# Patient Record
Sex: Female | Born: 1985 | Race: White | Hispanic: No | Marital: Married | Smoking: Never smoker
Health system: Southern US, Community
[De-identification: ages and names within clinical notes are randomized; demographics above are authoritative.]

## PROBLEM LIST (undated history)

## (undated) DIAGNOSIS — E872 Acidosis: Secondary | ICD-10-CM

## (undated) DIAGNOSIS — G44009 Cluster headache syndrome, unspecified, not intractable: Secondary | ICD-10-CM

## (undated) DIAGNOSIS — E119 Type 2 diabetes mellitus without complications: Secondary | ICD-10-CM

## (undated) DIAGNOSIS — E7800 Pure hypercholesterolemia, unspecified: Secondary | ICD-10-CM

## (undated) DIAGNOSIS — R079 Chest pain, unspecified: Secondary | ICD-10-CM

## (undated) DIAGNOSIS — R55 Syncope and collapse: Secondary | ICD-10-CM

## (undated) DIAGNOSIS — D72829 Elevated white blood cell count, unspecified: Secondary | ICD-10-CM

## (undated) DIAGNOSIS — K219 Gastro-esophageal reflux disease without esophagitis: Secondary | ICD-10-CM

## (undated) DIAGNOSIS — E785 Hyperlipidemia, unspecified: Secondary | ICD-10-CM

## (undated) HISTORY — DX: Chest pain, unspecified: R07.9

## (undated) HISTORY — PX: ABDOMINAL SURGERY: SHX537

## (undated) HISTORY — DX: Type 2 diabetes mellitus without complications: E11.9

## (undated) HISTORY — DX: Syncope and collapse: R55

## (undated) HISTORY — DX: Hyperlipidemia, unspecified: E78.5

## (undated) HISTORY — PX: BACK SURGERY: SHX140

---
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
## 2016-09-27 ENCOUNTER — Encounter (HOSPITAL_COMMUNITY): Payer: Self-pay | Admitting: *Deleted

## 2016-09-27 ENCOUNTER — Emergency Department (HOSPITAL_COMMUNITY): Payer: BLUE CROSS/BLUE SHIELD

## 2016-09-27 ENCOUNTER — Emergency Department (HOSPITAL_COMMUNITY)
Admission: EM | Admit: 2016-09-27 | Discharge: 2016-09-27 | Disposition: A | Discharge status: Home / Self Care | Payer: BLUE CROSS/BLUE SHIELD | Attending: Emergency Medicine | Admitting: Emergency Medicine

## 2016-09-27 DIAGNOSIS — R519 Headache, unspecified: Secondary | ICD-10-CM

## 2016-09-27 DIAGNOSIS — R51 Headache: Secondary | ICD-10-CM | POA: Diagnosis not present

## 2016-09-27 HISTORY — DX: Pure hypercholesterolemia, unspecified: E78.00

## 2016-09-27 HISTORY — DX: Cluster headache syndrome, unspecified, not intractable: G44.009

## 2016-09-27 LAB — BASIC METABOLIC PANEL
ANION GAP: 10 (ref 5–15)
BUN: 8 mg/dL (ref 6–20)
CALCIUM: 9.2 mg/dL (ref 8.9–10.3)
CHLORIDE: 104 mmol/L (ref 101–111)
CO2: 24 mmol/L (ref 22–32)
CREATININE: 0.86 mg/dL (ref 0.44–1.00)
GFR calc non Af Amer: >60 mL/min (ref >60)
Glucose, Bld: 80 mg/dL (ref 65–99)
Potassium: 3.5 mmol/L (ref 3.5–5.1)
SODIUM: 138 mmol/L (ref 135–145)

## 2016-09-27 LAB — CBC
HEMATOCRIT: 44.5 % (ref 36.0–46.0)
HEMOGLOBIN: 15 g/dL (ref 12.0–15.0)
MCH: 31.1 pg (ref 26.0–34.0)
MCHC: 33.7 g/dL (ref 30.0–36.0)
MCV: 92.1 fL (ref 78.0–100.0)
Platelets: 301 10*3/uL (ref 150–400)
RBC: 4.83 MIL/uL (ref 3.87–5.11)
RDW: 13 % (ref 11.5–15.5)
WBC: 11.3 10*3/uL — AB (ref 4.0–10.5)

## 2016-09-27 MED ORDER — METOCLOPRAMIDE HCL 5 MG/ML IJ SOLN
10.0000 mg | Freq: Once | INTRAMUSCULAR | Status: AC
  Administered 2016-09-27: 10 mg via INTRAVENOUS
  Filled 2016-09-27: qty 2

## 2016-09-27 MED ORDER — SODIUM CHLORIDE 0.9 % IV BOLUS (SEPSIS)
1000.0000 mL | Freq: Once | INTRAVENOUS | Status: AC
  Administered 2016-09-27: 1000 mL via INTRAVENOUS

## 2016-09-27 MED ORDER — DIPHENHYDRAMINE HCL 50 MG/ML IJ SOLN
25.0000 mg | Freq: Once | INTRAMUSCULAR | Status: AC
  Administered 2016-09-27: 25 mg via INTRAVENOUS
  Filled 2016-09-27: qty 1

## 2016-09-27 MED ORDER — DEXAMETHASONE SODIUM PHOSPHATE 10 MG/ML IJ SOLN
10.0000 mg | Freq: Once | INTRAMUSCULAR | Status: AC
  Administered 2016-09-27: 10 mg via INTRAVENOUS
  Filled 2016-09-27: qty 1

## 2016-09-27 MED ORDER — KETOROLAC TROMETHAMINE 30 MG/ML IJ SOLN
30.0000 mg | Freq: Once | INTRAMUSCULAR | Status: AC
  Administered 2016-09-27: 30 mg via INTRAVENOUS
  Filled 2016-09-27: qty 1

## 2016-09-27 MED ORDER — MORPHINE SULFATE (PF) 4 MG/ML IV SOLN
4.0000 mg | Freq: Once | INTRAVENOUS | Status: AC
  Administered 2016-09-27: 4 mg via INTRAVENOUS
  Filled 2016-09-27: qty 1

## 2016-09-27 NOTE — ED Notes (Signed)
The pt is c/o an intense pain to the back of her head since yesterday with nausea and she hurts down her entire spine.  Hx of cluster headaches several years ago

## 2016-09-27 NOTE — ED Triage Notes (Addendum)
Pt reports onset yesterday of pain to back of head. Also has back pain and intermittent numbness to bilateral arms and legs. Ambulatory at triage. Having nausea, no vomiting. Hx of cluster headaches. Reports if she turns her head, has shooting pain from back of head and radiates all the way down her back and to her feet.

## 2016-09-27 NOTE — ED Notes (Signed)
She has not taken any medication for pain since yesterday

## 2016-09-27 NOTE — ED Notes (Signed)
To ct

## 2016-09-27 NOTE — Discharge Instructions (Signed)
Please read and follow all provided instructions.  Your diagnoses today include:  1. Nonintractable headache, unspecified chronicity pattern, unspecified headache type     Tests performed today include: CT of your head which was normal and did not show any serious cause of your headache Vital signs. See below for your results today.   Medications:  In the Emergency Department you received: Reglan - antinausea/headache medication Benadryl - antihistamine to counteract potential side effects of reglan Toradol - NSAID medication similar to ibuprofen  Take any prescribed medications only as directed.  Additional information:  Follow any educational materials contained in this packet.  You are having a headache. No specific cause was found today for your headache. It may have been a migraine or other cause of headache. Stress, anxiety, fatigue, and depression are common triggers for headaches.   Your headache today does not appear to be life-threatening or require hospitalization, but often the exact cause of headaches is not determined in the emergency department. Therefore, follow-up with your doctor is very important to find out what may have caused your headache and whether or not you need any further diagnostic testing or treatment.   Sometimes headaches can appear benign (not harmful), but then more serious symptoms can develop which should prompt an immediate re-evaluation by your doctor or the emergency department.  BE VERY CAREFUL not to take multiple medicines containing Tylenol (also called acetaminophen). Doing so can lead to an overdose which can damage your liver and cause liver failure and possibly death.   Follow-up instructions: Please follow-up with your primary care provider in the next 3 days for further evaluation of your symptoms.   Return instructions:  Please return to the Emergency Department if you experience worsening symptoms. Return if the medications do not  resolve your headache, if it recurs, or if you have multiple episodes of vomiting or cannot keep down fluids. Return if you have a change from the usual headache. RETURN IMMEDIATELY IF you: Develop a sudden, severe headache Develop confusion or become poorly responsive or faint Develop a fever above 100.51F or problem breathing Have a change in speech, vision, swallowing, or understanding Develop new weakness, numbness, tingling, incoordination in your arms or legs Have a seizure Please return if you have any other emergent concerns.  Additional Information:  Your vital signs today were: BP 118/65    Pulse 98    Temp 97.6 F (36.4 C) (Oral)    Resp 20    LMP 09/10/2016    SpO2 100%  If your blood pressure (BP) was elevated above 135/85 this visit, please have this repeated by your doctor within one month. --------------

## 2016-09-27 NOTE — ED Provider Notes (Signed)
[facility]-EMERGENCY DEPT Provider Note   CSN: [id number] Arrival date & time: 09/27/16  1313  History   Chief Complaint Chief Complaint  Patient presents with  . Headache  . Numbness  . Back Pain    HPI Audrey Greene is a 31 y.o. female.  HPI  31 y.o. female with a hx of Cluster Headache, presents to the Emergency Department today complaining of posterior headache with onset yesterday while at rest. Rates pain 9/10. Intermittent with bouts lasting 1-2 hours. Pt states gradual in onset with associated blurriness in vision intermittently. Nausea with no emesis. Numbness BUE without weakness. Attempted OTC remedies with minimal relief. Pt also describes sensation down her spine from top of her head to lower lumbar spine. No associated fevers. No CP/SOB/ABD pain. Pt able to ambulate without difficulty. No other symptoms noted.     Past Medical History:  Diagnosis Date  . Cluster headaches   . High cholesterol     There are no active problems to display for this patient.   Past Surgical History:  Procedure Laterality Date  . BACK SURGERY      OB History    No data available       Home Medications    Prior to Admission medications   Not on File    Family History History reviewed. No pertinent family history.  Social History Social History  Substance Use Topics  . Smoking status: Never Smoker  . Smokeless tobacco: Not on file  . Alcohol use No     Allergies   Penicillins   Review of Systems Review of Systems ROS reviewed and all are negative for acute change except as noted in the HPI.  Physical Exam Updated Vital Signs BP 118/83 (BP Location: Right Arm)   Pulse 104   Temp 97.6 F (36.4 C) (Oral)   Resp 20   LMP 09/10/2016   SpO2 98%   Physical Exam  Constitutional: She is oriented to person, place, and time. Vital signs are normal. She appears well-developed and well-nourished.  HENT:  Head: Normocephalic and atraumatic.  Right Ear: Hearing normal.   Left Ear: Hearing normal.  Eyes: Conjunctivae and EOM are normal. Pupils are equal, round, and reactive to light.  Neck: Trachea normal, normal range of motion, full passive range of motion without pain and phonation normal. Neck supple. No spinous process tenderness and no muscular tenderness present. No neck rigidity. Normal range of motion present.  Cardiovascular: Normal rate, regular rhythm, normal heart sounds and intact distal pulses.   Pulmonary/Chest: Effort normal and breath sounds normal.  Abdominal: There is no tenderness.  Musculoskeletal: Normal range of motion.       Cervical back: Normal.       Thoracic back: Normal.       Lumbar back: Normal.  Neurological: She is alert and oriented to person, place, and time. She has normal strength. No cranial nerve deficit or sensory deficit.  Cranial Nerves:  II: Pupils equal, round, reactive to light III,IV, VI: ptosis not present, extra-ocular motions intact bilaterally  V,VII: smile symmetric, facial light touch sensation equal VIII: hearing grossly normal bilaterally  IX,X: midline uvula rise  XI: bilateral shoulder shrug equal and strong XII: midline tongue extension Negative pronator drift. Normal finger to nose exam. Equal sensation BUE sharp/dull. Grip strengths equal  Skin: Skin is warm and dry.  Psychiatric: She has a normal mood and affect. Her speech is normal and behavior is normal. Thought content normal.  Nursing note and  vitals reviewed.  ED Treatments / Results  Labs (all labs ordered are listed, but only abnormal results are displayed) Labs Reviewed  CBC - Abnormal; Notable for the following:       Result Value   WBC 11.3 (*)    All other components within normal limits  BASIC METABOLIC PANEL    EKG  EKG Interpretation None      Radiology Ct Head Wo Contrast  Result Date: 09/27/2016 CLINICAL DATA:  Headache and vomiting today. EXAM: CT HEAD WITHOUT CONTRAST TECHNIQUE: Contiguous axial images were  obtained from the base of the skull through the vertex without intravenous contrast. COMPARISON:  None. FINDINGS: Brain: Appears normal without hemorrhage, infarct, mass lesion, mass effect, midline shift or abnormal extra-axial fluid collection. No hydrocephalus or pneumocephalus. Vascular: Negative. Skull: Intact. Sinuses/Orbits: Negative. Other: None. IMPRESSION: Negative head CT. Electronically Signed   By: Drusilla Kanner M.D.   On: 09/27/2016 19:21    Procedures Procedures (including critical care time)  Medications Ordered in ED Medications - No data to display   Initial Impression / Assessment and Plan / ED Course  I have reviewed the triage vital signs and the nursing notes.  Pertinent labs & imaging results that were available during my care of the patient were reviewed by me and considered in my medical decision making (see chart for details).  Clinical Course    Final Clinical Impressions(s) / ED Diagnoses  {I have reviewed and evaluated the relevant laboratory values. {I have reviewed and evaluated the relevant imaging studies.  {I have reviewed the relevant previous healthcare records.  {I obtained HPI from historian. {Patient discussed with supervising physician.  ED Course:  Assessment: Patient is a 30yF hx Cluster headaches that presents with headache yesterday. Associated nausea. No emesis. Visual changes intermittent with blurriness. Numbness BUE intermittent. No fevers. No CP/SOB. Patient is without high-risk features of headache including: Sudden onset/thunderclap HA, No similar headache in past, Altered mental status, Accompanying seizure, Headache with exertion, Age > 50, History of immunocompromise, Neck or shoulder pain, Fever, Use of anticoagulation, Family history of spontaneous SAH, Concomitant drug use, Toxic exposure.  Patient has a normal complete neurological exam, normal vital signs, normal level of consciousness, no signs of meningismus, is  well-appearing/non-toxic appearing, no signs of trauma. No papilledema, no pain over the temporal arteries.CT Head unremarkable. CBC/BMP unremarkable. No dangerous or life-threatening conditions suspected or identified by history, physical exam, and by work-up. No indications for hospitalization identified. Pt symptoms relieved with migraine cocktail. Will DC home with follow up to Neurology for complex migraine. At time of discharge, Patient is in no acute distress. Vital Signs are stable. Patient is able to ambulate. Patient able to tolerate PO.   Disposition/Plan:  DC Home Additional Verbal discharge instructions given and discussed with patient.  Pt Instructed to f/u with PCP in the next week for evaluation and treatment of symptoms. Return precautions given Pt acknowledges and agrees with plan  Supervising Physician Lyndal Pulley, MD  Final diagnoses:  Nonintractable headache, unspecified chronicity pattern, unspecified headache type    New Prescriptions New Prescriptions   No medications on file     Audry Pili, PA-C 09/27/16 2032    Lyndal Pulley, MD 09/28/16 [phone]

## 2018-01-03 ENCOUNTER — Other Ambulatory Visit: Payer: Self-pay

## 2018-01-03 ENCOUNTER — Encounter (HOSPITAL_COMMUNITY): Payer: Self-pay

## 2018-01-03 ENCOUNTER — Emergency Department (HOSPITAL_COMMUNITY): Payer: BLUE CROSS/BLUE SHIELD

## 2018-01-03 ENCOUNTER — Emergency Department (HOSPITAL_COMMUNITY)
Admission: EM | Admit: 2018-01-03 | Discharge: 2018-01-04 | Disposition: A | Discharge status: Home / Self Care | Payer: BLUE CROSS/BLUE SHIELD | Attending: Emergency Medicine | Admitting: Emergency Medicine

## 2018-01-03 DIAGNOSIS — Y929 Unspecified place or not applicable: Secondary | ICD-10-CM | POA: Diagnosis not present

## 2018-01-03 DIAGNOSIS — S199XXA Unspecified injury of neck, initial encounter: Secondary | ICD-10-CM | POA: Diagnosis present

## 2018-01-03 DIAGNOSIS — Y999 Unspecified external cause status: Secondary | ICD-10-CM | POA: Insufficient documentation

## 2018-01-03 DIAGNOSIS — S161XXA Strain of muscle, fascia and tendon at neck level, initial encounter: Secondary | ICD-10-CM | POA: Diagnosis not present

## 2018-01-03 DIAGNOSIS — Y939 Activity, unspecified: Secondary | ICD-10-CM | POA: Insufficient documentation

## 2018-01-03 DIAGNOSIS — Z79899 Other long term (current) drug therapy: Secondary | ICD-10-CM | POA: Diagnosis not present

## 2018-01-03 DIAGNOSIS — X58XXXA Exposure to other specified factors, initial encounter: Secondary | ICD-10-CM | POA: Insufficient documentation

## 2018-01-03 DIAGNOSIS — M5412 Radiculopathy, cervical region: Secondary | ICD-10-CM | POA: Insufficient documentation

## 2018-01-03 NOTE — ED Triage Notes (Signed)
Pt presents with 2 day h/o R sided jaw pain that is now to R side of neck with R arm numbness.  Pt reports intermittent numbness to R arm, reports pain from neck that radiates down mid-back, into L buttock and down L leg.  Pt unsure of any dental issues, reports difficulty moving her head and difficulty swallowing.  Pt endorses fatigue.

## 2018-01-03 NOTE — ED Provider Notes (Signed)
Patient placed in Quick Look pathway, seen and evaluated .  Chief Complaint: R sided neck pain  HPI: Patient presents to ED for evaluation of 2-day history of right-sided lower dental pain that is now radiating to her right side of her neck, right arm.  She also reports intermittent "numbness" in her right arm that lasts for about 1 minute for each episode.  Symptoms will spontaneously appear and resolve.  States that she is most concerned about her right-sided neck pain.  Cannot recall any inciting event that may have triggered the symptoms.  Pain in right side of the neck is worse with movement and palpation.  ROS: R sided neck pain  Physical Exam:   Gen: No distress  Neuro: Awake and Alert  Skin: Warm    Focused Exam: No gross dental abscess noted.  No trismus, drooling or tonsillar enlargement noted.  Tenderness to palpation of the right paraspinal musculature of the cervical spine. Pain with neck ROM.  Equal grip strength in bilateral upper extremities and strength noted to be 5/5 in BUE. Normal sensation in BUE.   Initiation of care has begun. The patient has been counseled on the process, plan, and necessity for staying for the completion/evaluation, and the remainder of the medical screening examination.   Dietrich PatesKhatri, Story Vanvranken, PA-C 01/03/18 1600    Raeford RazorKohut, Stephen, MD 01/05/18 1008

## 2018-01-04 ENCOUNTER — Emergency Department (HOSPITAL_COMMUNITY): Payer: BLUE CROSS/BLUE SHIELD

## 2018-01-04 LAB — CBC WITH DIFFERENTIAL/PLATELET
BASOS ABS: 0 10*3/uL (ref 0.0–0.1)
BASOS PCT: 0 %
EOS ABS: 0.1 10*3/uL (ref 0.0–0.7)
EOS PCT: 1 %
HCT: 45.7 % (ref 36.0–46.0)
HEMOGLOBIN: 15.1 g/dL — AB (ref 12.0–15.0)
Lymphocytes Relative: 19 %
Lymphs Abs: 2.6 10*3/uL (ref 0.7–4.0)
MCH: 30.3 pg (ref 26.0–34.0)
MCHC: 33 g/dL (ref 30.0–36.0)
MCV: 91.8 fL (ref 78.0–100.0)
Monocytes Absolute: 0.6 10*3/uL (ref 0.1–1.0)
Monocytes Relative: 4 %
NEUTROS PCT: 76 %
Neutro Abs: 10.5 10*3/uL — ABNORMAL HIGH (ref 1.7–7.7)
PLATELETS: 290 10*3/uL (ref 150–400)
RBC: 4.98 MIL/uL (ref 3.87–5.11)
RDW: 13.3 % (ref 11.5–15.5)
WBC: 13.8 10*3/uL — AB (ref 4.0–10.5)

## 2018-01-04 LAB — BASIC METABOLIC PANEL
Anion gap: 12 (ref 5–15)
BUN: 7 mg/dL (ref 6–20)
CO2: 21 mmol/L — ABNORMAL LOW (ref 22–32)
Calcium: 9.1 mg/dL (ref 8.9–10.3)
Chloride: 103 mmol/L (ref 101–111)
Creatinine, Ser: 0.76 mg/dL (ref 0.44–1.00)
Glucose, Bld: 127 mg/dL — ABNORMAL HIGH (ref 65–99)
POTASSIUM: 3.6 mmol/L (ref 3.5–5.1)
SODIUM: 136 mmol/L (ref 135–145)

## 2018-01-04 MED ORDER — DEXAMETHASONE SODIUM PHOSPHATE 10 MG/ML IJ SOLN
10.0000 mg | Freq: Once | INTRAMUSCULAR | Status: AC
Start: 2018-01-04 — End: 2018-01-04
  Administered 2018-01-04: 10 mg via INTRAVENOUS
  Filled 2018-01-04: qty 1

## 2018-01-04 MED ORDER — HYDROMORPHONE HCL 2 MG/ML IJ SOLN
1.0000 mg | Freq: Once | INTRAMUSCULAR | Status: AC
  Administered 2018-01-04: 1 mg via INTRAVENOUS
  Filled 2018-01-04: qty 1

## 2018-01-04 MED ORDER — HYDROCODONE-ACETAMINOPHEN 5-325 MG PO TABS: 1.0000 | ORAL_TABLET | ORAL | 0 refills | Status: DC | PRN

## 2018-01-04 MED ORDER — ONDANSETRON HCL 4 MG/2ML IJ SOLN
4.0000 mg | Freq: Once | INTRAMUSCULAR | Status: AC
  Administered 2018-01-04: 4 mg via INTRAVENOUS
  Filled 2018-01-04: qty 2

## 2018-01-04 MED ORDER — PREDNISONE 20 MG PO TABS: ORAL_TABLET | ORAL | 0 refills | Status: DC

## 2018-01-04 MED ORDER — IBUPROFEN 800 MG PO TABS: 800.0000 mg | ORAL_TABLET | Freq: Three times a day (TID) | ORAL | 0 refills | Status: DC | PRN

## 2018-01-04 NOTE — ED Provider Notes (Signed)
[facility]   CSN: [id number] Arrival date & time: 01/03/18  1430     History   Chief Complaint Chief Complaint  Patient presents with  . Neck Pain    HPI Audrey Greene is a 32 y.o. female.  Patient presents with complaints of pain in her neck.  Symptoms began 2 days ago.  Patient reports a sharp, constant pain in the neck with radiation to the head, face as well as down her right arm.  She denies any injury.  She has a history of lumbar surgery but no history of cervical pathology.  Patient has not noticed weakness of her upper extremities but does notice numbness of the right arm.  All of her symptoms worsen with movements of the head and neck.     Past Medical History:  Diagnosis Date  . Cluster headaches   . High cholesterol     There are no active problems to display for this patient.   Past Surgical History:  Procedure Laterality Date  . ABDOMINAL SURGERY    . BACK SURGERY       OB History   None      Home Medications    Prior to Admission medications   Medication Sig Start Date End Date Taking? Authorizing Provider  BACLOFEN PO Take 1 tablet by mouth once.   Yes [provider]  cetirizine (ZYRTEC) 10 MG tablet Take 10 mg by mouth daily as needed for allergies.   Yes [provider]  HYDROcodone-acetaminophen (NORCO/VICODIN) 5-325 MG tablet Take 1 tablet by mouth every 4 (four) hours as needed for moderate pain. 01/04/18   Gilda CreasePollina, Twila Rappa J, MD  ibuprofen (ADVIL,MOTRIN) 800 MG tablet Take 1 tablet (800 mg total) by mouth every 8 (eight) hours as needed for moderate pain. 01/04/18   Gilda CreasePollina, Jaceion Aday J, MD  predniSONE (DELTASONE) 20 MG tablet 3 tabs po daily x 3 days, then 2 tabs x 3 days, then 1.5 tabs x 3 days, then 1 tab x 3 days, then 0.5 tabs x 3 days 01/04/18   Gilda CreasePollina, Jatasia Gundrum J, MD    Family History History reviewed. No pertinent family history.  Social  History Social History   Tobacco Use  . Smoking status: Never Smoker  . Smokeless tobacco: Never Used  Substance Use Topics  . Alcohol use: No  . Drug use: No     Allergies   Penicillins   Review of Systems Review of Systems  Musculoskeletal: Positive for neck pain.  Neurological: Positive for numbness. Negative for weakness.  All other systems reviewed and are negative.    Physical Exam Updated Vital Signs BP 112/79   Pulse 77   Temp 98 F (36.7 C) (Oral)   Resp 16   Ht 5\' 2"  (1.575 m)   Wt 102.1 kg (225 lb)   LMP 01/03/2018   SpO2 93%   BMI 41.15 kg/m   Physical Exam  Constitutional: She is oriented to person, place, and time. She appears well-developed and well-nourished. No distress.  HENT:  Head: Normocephalic and atraumatic.  Right Ear: Hearing normal.  Left Ear: Hearing normal.  Nose: Nose normal.  Mouth/Throat: Oropharynx is clear and moist and mucous membranes are normal.  Eyes: Pupils are equal, round, and reactive to light. Conjunctivae and EOM are normal.  Neck: Neck supple. Muscular tenderness present. Decreased range of motion present.  Cardiovascular: Regular rhythm, S1 normal and S2 normal. Exam reveals no gallop and no friction rub.  No murmur heard. Pulmonary/Chest: Effort normal and breath sounds normal. No respiratory distress. She exhibits no tenderness.  Abdominal: Soft. Normal appearance and bowel sounds are normal. There is no hepatosplenomegaly. There is no tenderness. There is no rebound, no guarding, no tenderness at McBurney's point and negative Murphy's sign. No hernia.  Neurological: She is alert and oriented to person, place, and time. She has normal strength. No cranial nerve deficit or sensory deficit. Coordination normal. GCS eye subscore is 4. GCS verbal subscore is 5. GCS motor subscore is 6.  5 out of 5 strength bilateral upper extremities with normal sensation to light touch  Skin: Skin is warm, dry and intact. No rash noted.  No cyanosis.  Psychiatric: She has a normal mood and affect. Her speech is normal and behavior is normal. Thought content normal.  Nursing note and vitals reviewed.    ED Treatments / Results  Labs (all labs ordered are listed, but only abnormal results are displayed) Labs Reviewed  CBC WITH DIFFERENTIAL/PLATELET - Abnormal; Notable for the following components:      Result Value   WBC 13.8 (*)    Hemoglobin 15.1 (*)    Neutro Abs 10.5 (*)    All other components within normal limits  BASIC METABOLIC PANEL - Abnormal; Notable for the following components:   CO2 21 (*)    Glucose, Bld 127 (*)    All other components within normal limits    EKG None  Radiology Dg Cervical Spine Complete  Result Date: 01/03/2018 CLINICAL DATA:  Right-sided neck pain for 1 day, no known injury, initial encounter EXAM: CERVICAL SPINE - COMPLETE 4+ VIEW COMPARISON:  None. FINDINGS: Seven cervical segments are well visualized. Vertebral body height is well maintained. The neural foramina are widely patent bilaterally. No soft tissue abnormality is seen. No acute fracture or acute facet abnormality is noted. The odontoid is within normal limits. IMPRESSION: No acute abnormality noted. Electronically Signed   By: Alcide Clever M.D.   On: 01/03/2018 17:04   Mr Cervical Spine Wo Contrast  Result Date: 01/04/2018 CLINICAL DATA:  Initial evaluation for acute right-sided jaw pain with extension into the right neck, with right arm numbness. EXAM: MRI CERVICAL SPINE WITHOUT CONTRAST TECHNIQUE: Multiplanar, multisequence MR imaging of the cervical spine was performed. No intravenous contrast was administered. COMPARISON:  Prior radiograph from 01/03/2018. FINDINGS: Alignment: Straightening of the normal cervical lordosis. No listhesis or subluxation. Vertebrae: Vertebral body heights are well maintained without evidence for acute or chronic fracture. Bone marrow signal intensity within normal limits. No discrete or  worrisome osseous lesions. No abnormal marrow edema. Cord: Signal intensity within the cervical spinal cord is normal. Cord caliber and morphology are normal. Posterior Fossa, vertebral arteries, paraspinal tissues: Visualized brain and posterior fossa within normal limits. Craniocervical junction normal. Paraspinous and prevertebral soft tissues within normal limits. Normal intravascular flow voids present within the vertebral arteries bilaterally. Disc levels: C2-C3: Unremarkable. C3-C4:  Unremarkable. C4-C5:  Unremarkable. C5-C6: Mild annular disc bulge. No canal or neural foraminal stenosis. C6-C7: Mild diffuse annular disc bulge. No canal or foraminal stenosis. C7-T1:  Unremarkable. Visualized upper thoracic spine demonstrates no significant finding. IMPRESSION: 1. Mild annular disc bulging at C5-6 and C6-7 without significant stenosis. 2. Otherwise unremarkable MRI of the cervical spine. Electronically Signed   By: Rise Mu M.D.   On: 01/04/2018 03:03    Procedures Procedures (including critical care time)  Medications Ordered in ED Medications  HYDROmorphone (DILAUDID) injection 1 mg (1 mg  Intravenous Given 01/04/18 0054)  ondansetron (ZOFRAN) injection 4 mg (4 mg Intravenous Given 01/04/18 0054)  dexamethasone (DECADRON) injection 10 mg (10 mg Intravenous Given 01/04/18 0054)     Initial Impression / Assessment and Plan / ED Course  I have reviewed the triage vital signs and the nursing notes.  Pertinent labs & imaging results that were available during my care of the patient were reviewed by me and considered in my medical decision making (see chart for details).     Patient presents to the ER for evaluation of neck pain that radiates to the head and down her right arm.  She has a history of lower back problems requiring previous lumbar surgery.  She has not had previous cervical spine pathology.  She is reporting numbness of the right arm but does not have decreased sensation  or strength.  Patient underwent cervical spine MRI to further evaluate.  There is no significant pathology.  She is experiencing severe pain with any movement of the neck.  Radiculopathy is likely secondary to severe spasm of the neck. Treat with analgesia.  Final Clinical Impressions(s) / ED Diagnoses   Final diagnoses:  Acute strain of neck muscle, initial encounter  Cervical radiculopathy    ED Discharge Orders        Ordered    predniSONE (DELTASONE) 20 MG tablet     01/04/18 0340    ibuprofen (ADVIL,MOTRIN) 800 MG tablet  Every 8 hours PRN     01/04/18 0340    HYDROcodone-acetaminophen (NORCO/VICODIN) 5-325 MG tablet  Every 4 hours PRN     01/04/18 0340       Gilda Crease, MD 01/04/18 [phone]

## 2018-11-10 ENCOUNTER — Observation Stay (HOSPITAL_COMMUNITY)
Admission: EM | Admit: 2018-11-10 | Discharge: 2018-11-11 | Disposition: A | Discharge status: Home / Self Care | Payer: BLUE CROSS/BLUE SHIELD | Attending: Internal Medicine | Admitting: Internal Medicine

## 2018-11-10 ENCOUNTER — Other Ambulatory Visit: Payer: Self-pay

## 2018-11-10 ENCOUNTER — Emergency Department (HOSPITAL_COMMUNITY): Payer: BLUE CROSS/BLUE SHIELD

## 2018-11-10 ENCOUNTER — Encounter (HOSPITAL_COMMUNITY): Payer: Self-pay | Admitting: Emergency Medicine

## 2018-11-10 DIAGNOSIS — E785 Hyperlipidemia, unspecified: Secondary | ICD-10-CM | POA: Diagnosis present

## 2018-11-10 DIAGNOSIS — R079 Chest pain, unspecified: Secondary | ICD-10-CM | POA: Diagnosis not present

## 2018-11-10 DIAGNOSIS — E872 Acidosis, unspecified: Secondary | ICD-10-CM | POA: Diagnosis present

## 2018-11-10 DIAGNOSIS — R55 Syncope and collapse: Secondary | ICD-10-CM

## 2018-11-10 DIAGNOSIS — G43909 Migraine, unspecified, not intractable, without status migrainosus: Secondary | ICD-10-CM | POA: Insufficient documentation

## 2018-11-10 DIAGNOSIS — K219 Gastro-esophageal reflux disease without esophagitis: Secondary | ICD-10-CM | POA: Diagnosis not present

## 2018-11-10 DIAGNOSIS — Z79899 Other long term (current) drug therapy: Secondary | ICD-10-CM | POA: Insufficient documentation

## 2018-11-10 DIAGNOSIS — R600 Localized edema: Secondary | ICD-10-CM | POA: Insufficient documentation

## 2018-11-10 DIAGNOSIS — Z6841 Body Mass Index (BMI) 40.0 and over, adult: Secondary | ICD-10-CM | POA: Insufficient documentation

## 2018-11-10 DIAGNOSIS — D72829 Elevated white blood cell count, unspecified: Secondary | ICD-10-CM | POA: Diagnosis present

## 2018-11-10 DIAGNOSIS — R42 Dizziness and giddiness: Secondary | ICD-10-CM | POA: Insufficient documentation

## 2018-11-10 DIAGNOSIS — R000 Tachycardia, unspecified: Secondary | ICD-10-CM | POA: Insufficient documentation

## 2018-11-10 DIAGNOSIS — Z880 Allergy status to penicillin: Secondary | ICD-10-CM | POA: Insufficient documentation

## 2018-11-10 DIAGNOSIS — Z8249 Family history of ischemic heart disease and other diseases of the circulatory system: Secondary | ICD-10-CM | POA: Diagnosis not present

## 2018-11-10 HISTORY — DX: Syncope and collapse: R55

## 2018-11-10 LAB — I-STAT BETA HCG BLOOD, ED (MC, WL, AP ONLY): I-stat hCG, quantitative: <5 m[IU]/mL (ref <5)

## 2018-11-10 LAB — COMPREHENSIVE METABOLIC PANEL
ALK PHOS: 67 U/L (ref 38–126)
ALT: 20 U/L (ref 0–44)
ANION GAP: 13 (ref 5–15)
AST: 17 U/L (ref 15–41)
Albumin: 3.4 g/dL — ABNORMAL LOW (ref 3.5–5.0)
BUN: 11 mg/dL (ref 6–20)
CALCIUM: 8.8 mg/dL — AB (ref 8.9–10.3)
CO2: 18 mmol/L — AB (ref 22–32)
Chloride: 105 mmol/L (ref 98–111)
Creatinine, Ser: 0.72 mg/dL (ref 0.44–1.00)
Glucose, Bld: 119 mg/dL — ABNORMAL HIGH (ref 70–99)
Potassium: 4.1 mmol/L (ref 3.5–5.1)
SODIUM: 136 mmol/L (ref 135–145)
Total Bilirubin: 0.3 mg/dL (ref 0.3–1.2)
Total Protein: 6.9 g/dL (ref 6.5–8.1)

## 2018-11-10 LAB — CBC WITH DIFFERENTIAL/PLATELET
Abs Immature Granulocytes: 0.05 10*3/uL (ref 0.00–0.07)
BASOS ABS: 0.1 10*3/uL (ref 0.0–0.1)
Basophils Relative: 0 %
EOS PCT: 2 %
Eosinophils Absolute: 0.3 10*3/uL (ref 0.0–0.5)
HEMATOCRIT: 44.8 % (ref 36.0–46.0)
Hemoglobin: 14.6 g/dL (ref 12.0–15.0)
Immature Granulocytes: 0 %
LYMPHS ABS: 4.3 10*3/uL — AB (ref 0.7–4.0)
LYMPHS PCT: 33 %
MCH: 30.4 pg (ref 26.0–34.0)
MCHC: 32.6 g/dL (ref 30.0–36.0)
MCV: 93.1 fL (ref 80.0–100.0)
MONOS PCT: 8 %
Monocytes Absolute: 1 10*3/uL (ref 0.1–1.0)
NRBC: 0 % (ref 0.0–0.2)
Neutro Abs: 7.4 10*3/uL (ref 1.7–7.7)
Neutrophils Relative %: 57 %
Platelets: 301 10*3/uL (ref 150–400)
RBC: 4.81 MIL/uL (ref 3.87–5.11)
RDW: 13.1 % (ref 11.5–15.5)
WBC: 13 10*3/uL — AB (ref 4.0–10.5)

## 2018-11-10 LAB — D-DIMER, QUANTITATIVE (NOT AT ARMC)

## 2018-11-10 LAB — I-STAT TROPONIN, ED: Troponin i, poc: 0 ng/mL (ref 0.00–0.08)

## 2018-11-10 LAB — TSH: TSH: 3.141 u[IU]/mL (ref 0.350–4.500)

## 2018-11-10 MED ORDER — SODIUM CHLORIDE 0.9 % IV BOLUS
1000.0000 mL | Freq: Once | INTRAVENOUS | Status: AC
  Administered 2018-11-10: 1000 mL via INTRAVENOUS

## 2018-11-10 MED ORDER — LACTATED RINGERS IV BOLUS
1000.0000 mL | Freq: Once | INTRAVENOUS | Status: AC
  Administered 2018-11-10: 1000 mL via INTRAVENOUS

## 2018-11-10 NOTE — ED Triage Notes (Signed)
Pt reports she got dizzy earlier this date. Pt's husband advised she passed out for about 30 secs shortly after walking to the kitchen. Pt's husband thought the pt was playing around with him. Pt reports she felt as though the room was spinning when she came around. Pt also reports her watch has been alarming her letting her know that her heart was beating fast.

## 2018-11-10 NOTE — ED Notes (Signed)
ED Provider at bedside. 

## 2018-11-10 NOTE — H&P (Addendum)
History and Physical    Audrey Greene TKW:[id number] DOB: 1986-08-15 DOA: 11/10/2018  Referring MD/NP/PA:   PCP: Patient, No Pcp Per   Patient coming from:  The patient is coming from home.  At baseline, pt is independent for most of ADL.        Chief Complaint: Syncope  HPI: Audrey Greene is a 33 y.o. female with medical history significant of obesity, hyperlipidemia, GERD, cluster headache, palpitation, who presents with syncope.  Patient states that she started feeling dizzy and heart racing when she was walked to the refrigerator at home at about 4 PM. She also felt room spinning around her. Then, she passed out for about 30 seconds. Episode was witnessed by her husband. No seizure activity or jerking movement noted.  Patient denies any unilateral weakness, numbness or tingling to extremities.  No facial droop or slurred speech.  Patient states that she has left-sided chest pain, which is located under left breast area, initially 10 out of 10 in severity, currently 1 out of 10 in severity, nonradiating, sharp.  The pain is not pleuritic, not aggravated by deep breath.  Patient has nausea, but no vomiting, diarrhea or abdominal pain.  Denies symptoms of UTI.  No fever or chills.  She states that she has mild bilateral ankle edema recently.  ED Course: pt was found to have negative d-dimer, WBC 13.0, negative troponin, negative pregnancy test, bicarbonate 18, creatinine and BUN normal, anion gap 13, temperature normal, tachycardia, oxygen saturation 98% on room air, negative chest x-ray, negative CT head for acute intracranial abnormalities.  Patient is placed on telemetry bed for observation.  Review of Systems:   General: no fevers, chills, no body weight gain HEENT: no blurry vision, hearing changes or sore throat Respiratory: no dyspnea, coughing, wheezing CV: has chest pain, no palpitations GI: has nausea, no vomiting, abdominal pain, diarrhea, constipation GU: no dysuria, burning on  urination, increased urinary frequency, hematuria  Ext: has mild ankle edema Neuro: no unilateral weakness, numbness, or tingling, no vision change or hearing loss.  Has dizziness and syncope. Skin: no rash, no skin tear. MSK: No muscle spasm, no deformity, no limitation of range of movement in spin Heme: No easy bruising.  Travel history: No recent long distant travel.  Allergy:  Allergies  Allergen Reactions  . Penicillins     Has patient had a PCN reaction causing immediate rash, facial/tongue/throat swelling, SOB or lightheadedness with hypotension: unknown Has patient had a PCN reaction causing severe rash involving mucus membranes or skin necrosis: Unknown Has patient had a PCN reaction that required hospitalization: Unknown Has patient had a PCN reaction occurring within the last 10 years: No If all of the above answers are "NO", then may proceed with Cephalosporin use.     Past Medical History:  Diagnosis Date  . Cluster headaches   . High cholesterol     Past Surgical History:  Procedure Laterality Date  . ABDOMINAL SURGERY    . BACK SURGERY      Social History:  reports that she has never smoked. She has never used smokeless tobacco. She reports that she does not drink alcohol or use drugs.  Family History:  Family History  Problem Relation Age of Onset  . Hypertension Mother   . Hyperlipidemia Mother   . CAD Father   . Hyperlipidemia Father   . Hypertension Brother   . Hyperlipidemia Brother      Prior to Admission medications   Medication Sig Start Date End  Date Taking? Authorizing Provider  BACLOFEN PO Take 1 tablet by mouth once.    [provider]  cetirizine (ZYRTEC) 10 MG tablet Take 10 mg by mouth daily as needed for allergies.    [provider]  HYDROcodone-acetaminophen (NORCO/VICODIN) 5-325 MG tablet Take 1 tablet by mouth every 4 (four) hours as needed for moderate pain. 01/04/18   Gilda Crease, MD  ibuprofen  (ADVIL,MOTRIN) 800 MG tablet Take 1 tablet (800 mg total) by mouth every 8 (eight) hours as needed for moderate pain. 01/04/18   Gilda Crease, MD  phentermine (ADIPEX-P) 37.5 MG tablet TK 1 T PO QD 07/17/18   [provider]  predniSONE (DELTASONE) 20 MG tablet 3 tabs po daily x 3 days, then 2 tabs x 3 days, then 1.5 tabs x 3 days, then 1 tab x 3 days, then 0.5 tabs x 3 days 01/04/18   Gilda Crease, MD    Physical Exam: Vitals:   11/10/18 2300 11/10/18 2330 11/11/18 0000 11/11/18 0051  BP: 127/77 124/86 125/69 131/86  Pulse: 98 98 96 93  Resp: Temp:    98.3 F (36.8 C)  TempSrc:    Oral  SpO2: 100% 98% 98% 99%  Weight:      Height:       General: Not in acute distress HEENT:       Eyes: PERRL, EOMI, no scleral icterus.       ENT: No discharge from the ears and nose, no pharynx injection, no tonsillar enlargement.        Neck: No JVD, no bruit, no mass felt. Heme: No neck lymph node enlargement. Cardiac: S1/S2, RRR, No murmurs, No gallops or rubs. Respiratory: No rales, wheezing, rhonchi or rubs. Chest wall: pt has reproducible chest wall tenderness on the left lower chest GI: Soft, nondistended, nontender, no rebound pain, no organomegaly, BS present. GU: No hematuria Ext: has mild bilateral ankle edema.  2+DP/PT pulse bilaterally. Musculoskeletal: No joint deformities, No joint redness or warmth, no limitation of ROM in spin. Skin: No rashes.  Neuro: Alert, oriented X3, cranial nerves II-XII grossly intact, moves all extremities normally. Muscle strength 5/5 in all extremities, sensation to light touch intact. Brachial reflex 2+ bilaterally. Negative Babinski's sign. Normal finger to nose test. Psych: Patient is not psychotic, no suicidal or hemocidal ideation.  Labs on Admission: I have personally reviewed following labs and imaging studies  CBC: Recent Labs  Lab 11/10/18 1810  WBC 13.0*  NEUTROABS 7.4  HGB 14.6  HCT 44.8  MCV  93.1  PLT 301   Basic Metabolic Panel: Recent Labs  Lab 11/10/18 1810  NA 136  K 4.1  CL 105  CO2 18*  GLUCOSE 119*  BUN 11  CREATININE 0.72  CALCIUM 8.8*   GFR: Estimated Creatinine Clearance: 112.3 mL/min (by C-G formula based on SCr of 0.72 mg/dL). Liver Function Tests: Recent Labs  Lab 11/10/18 1810  AST 17  ALT 20  ALKPHOS 67  BILITOT 0.3  PROT 6.9  ALBUMIN 3.4*   No results for input(s): LIPASE, AMYLASE in the last 168 hours. No results for input(s): AMMONIA in the last 168 hours. Coagulation Profile: No results for input(s): INR, PROTIME in the last 168 hours. Cardiac Enzymes: Recent Labs  Lab 11/11/18 0031  TROPONINI <0.03   BNP (last 3 results) No results for input(s): PROBNP in the last 8760 hours. HbA1C: No results for input(s): HGBA1C in the last 72 hours. CBG:  No results for input(s): GLUCAP in the last 168 hours. Lipid Profile: No results for input(s): CHOL, HDL, LDLCALC, TRIG, CHOLHDL, LDLDIRECT in the last 72 hours. Thyroid Function Tests: Recent Labs    11/10/18 1816  TSH 3.141   Anemia Panel: No results for input(s): VITAMINB12, FOLATE, FERRITIN, TIBC, IRON, RETICCTPCT in the last 72 hours. Urine analysis:    Component Value Date/Time   COLORURINE YELLOW 11/11/2018 0057   APPEARANCEUR HAZY (A) 11/11/2018 0057   LABSPEC 1.020 11/11/2018 0057   PHURINE 6.0 11/11/2018 0057   GLUCOSEU NEGATIVE 11/11/2018 0057   HGBUR NEGATIVE 11/11/2018 0057   BILIRUBINUR NEGATIVE 11/11/2018 0057   KETONESUR NEGATIVE 11/11/2018 0057   PROTEINUR NEGATIVE 11/11/2018 0057   NITRITE NEGATIVE 11/11/2018 0057   LEUKOCYTESUR NEGATIVE 11/11/2018 0057   Sepsis Labs: @LABRCNTIP (procalcitonin:4,lacticidven:4) )No results found for this or any previous visit (from the past 240 hour(s)).   Radiological Exams on Admission: Ct Head Wo Contrast  Result Date: 11/10/2018 CLINICAL DATA:  Syncope EXAM: CT HEAD WITHOUT CONTRAST TECHNIQUE: Contiguous axial images  were obtained from the base of the skull through the vertex without intravenous contrast. COMPARISON:  09/27/2016 FINDINGS: Brain: No acute intracranial abnormality. Specifically, no hemorrhage, hydrocephalus, mass lesion, acute infarction, or significant intracranial injury. Vascular: No hyperdense vessel or unexpected calcification. Skull: No acute calvarial abnormality. Sinuses/Orbits: Visualized paranasal sinuses and mastoids clear. Orbital soft tissues unremarkable. Other: None IMPRESSION: Normal head CT. Electronically Signed   By: Charlett Nose M.D.   On: 11/10/2018 19:36   Dg Chest Portable 1 View  Result Date: 11/10/2018 CLINICAL DATA:  Dizziness and syncope today. EXAM: PORTABLE CHEST 1 VIEW COMPARISON:  PA and lateral chest 05/09/2006. FINDINGS: Lungs clear. Heart size normal. No pneumothorax or pleural fluid. No acute or focal bony abnormality. IMPRESSION: No active disease. Electronically Signed   By: Drusilla Kanner M.D.   On: 11/10/2018 18:08     EKG: Independently reviewed.  Sinus rhythm, QTC 455, early R wave progression, T wave inversion in lead III/aVF  Assessment/Plan Principal Problem:   Syncope Active Problems:   Chest pain   Hyperlipidemia   GERD (gastroesophageal reflux disease)   Metabolic acidosis   Leukocytosis   Syncope: Etiology is not clear.  CT head is negative for acute intracranial abnormalities.  Patient states that she had palpitation in the past, but did not follow-up for work-up, indicating possible cardiac arrhythmia or tachycardia.  Patient used to take phentermine for losing weight, and stopped taking on 07/2019.  She could have endocardial damage from the side effects of the phentermine.  Another potential differential diagnosis is PE given tachycardia and chest pain, but her d-dimer is negative, her chest pain is not pleuritic.  Patient does not have oxygen desaturation. At this moment, I have low suspicion for PE.  Other differential diagnosis include  vasovagal syncope, TIA/stroke and ACS, drug abuse, orthostatic status, hypertrophic cardiomyopathy (IHSS).  - Place on tele bed for obs - Orthostatic vital signs  - MRI-brain - 2d echo - Neuro checks  - IVF: 1L of NS and 1L of ringer solution in ED, then 100 cc/h of NS - PT/OT eval and treat  Chest pain: Etiology is not clear.  Patient has reproducible chest wall tenderness, indicating costochondritis or muscular skeletal pain. Low suspicions for PE as above - cycle CE q6 x3 and repeat EKG in the am  - prn Nitroglycerin, Morphine, and aspirin, tylenol prn - Risk factor stratification: will check FLP and A1C, UDS  - 2d  echo  Hyperlipidemia: pt is not taking med at home -f/u FLP  GERD (gastroesophageal reflux disease): -protonix  Metabolic acidosis: Bicarbonate 18, anion gap 13.  Lactic acid 1.2.  Etiology is not clear. -IV fluid as above  Morbid obesity: BMI 42.07 -Diet and exercise.   -Encouraged to lose weight.   Leukocytosis: WBC 13.0. No signs of infection. CXR negative. Likely due to stress induced to demargination. -will follow up blood and urine culture -follow up by CBC   DVT ppx: SQ Lovenox Code Status: Full code Family Communication:  Yes, patient's friend   at bed side Disposition Plan:  Anticipate discharge back to previous home environment Consults called:   none Admission status: Obs / tele        Date of Service 11/11/2018    Lorretta HarpXilin Jakevion Arney [facility]   If 7PM-7AM, please contact night-coverage [email or web address] Password [email or web address] 11/11/2018, 6:05 AM

## 2018-11-10 NOTE — ED Provider Notes (Signed)
[facility] EMERGENCY DEPARTMENT Provider Note   CSN: [id number] Arrival date & time: 11/10/18  1700    History   Chief Complaint Chief Complaint  Patient presents with  . Loss of Consciousness    HPI [place]i [place] is a 33 y.o. female.      Loss of Consciousness  Episode history:  Single Most recent episode:  Today Duration:  30 seconds Timing:  Rare Progression:  Resolved Chronicity:  New Context comment:  Syncope, palpitations, lightheadedness Witnessed: yes   Relieved by:  None tried Worsened by:  Nothing Ineffective treatments:  None tried Associated symptoms: dizziness, palpitations and visual change (Room spinning now resolved)   Associated symptoms: no anxiety, no chest pain, no difficulty breathing, no fever, no focal sensory loss, no focal weakness, no headaches, no malaise/fatigue, no nausea, no recent fall, no recent injury, no recent surgery, no rectal bleeding, no seizures, no shortness of breath, no vomiting and no weakness   Risk factors: no congenital heart disease, no coronary artery disease, no seizures and no vascular disease     Past Medical History:  Diagnosis Date  . Cluster headaches   . High cholesterol     Patient Active Problem List   Diagnosis Date Noted  . Syncope 11/10/2018    Past Surgical History:  Procedure Laterality Date  . ABDOMINAL SURGERY    . BACK SURGERY       OB History   No obstetric history on file.      Home Medications    Prior to Admission medications   Medication Sig Start Date End Date Taking? Authorizing Provider  BACLOFEN PO Take 1 tablet by mouth once.    [provider]  cetirizine (ZYRTEC) 10 MG tablet Take 10 mg by mouth daily as needed for allergies.    [provider]  HYDROcodone-acetaminophen (NORCO/VICODIN) 5-325 MG tablet Take 1 tablet by mouth every 4 (four) hours as needed for moderate pain. 01/04/18   Gilda Crease, MD  ibuprofen (ADVIL,MOTRIN)  800 MG tablet Take 1 tablet (800 mg total) by mouth every 8 (eight) hours as needed for moderate pain. 01/04/18   Gilda Crease, MD  phentermine (ADIPEX-P) 37.5 MG tablet TK 1 T PO QD 07/17/18   [provider]  predniSONE (DELTASONE) 20 MG tablet 3 tabs po daily x 3 days, then 2 tabs x 3 days, then 1.5 tabs x 3 days, then 1 tab x 3 days, then 0.5 tabs x 3 days 01/04/18   Gilda Crease, MD    Family History No family history on file.  Social History Social History   Tobacco Use  . Smoking status: Never Smoker  . Smokeless tobacco: Never Used  Substance Use Topics  . Alcohol use: No  . Drug use: No     Allergies   Penicillins   Review of Systems Review of Systems  Constitutional: Negative for chills, fever and malaise/fatigue.  HENT: Negative for ear pain and sore throat.   Eyes: Negative for pain and visual disturbance.  Respiratory: Negative for cough and shortness of breath.   Cardiovascular: Positive for palpitations and syncope. Negative for chest pain.  Gastrointestinal: Negative for abdominal pain, nausea and vomiting.  Genitourinary: Negative for dysuria and hematuria.  Musculoskeletal: Negative for arthralgias and back pain.  Skin: Negative for color change and rash.  Neurological: Positive for dizziness. Negative for focal weakness, seizures, syncope, weakness and headaches.  All other systems reviewed and are negative.  Physical Exam Updated Vital Signs BP 105/68   Pulse (!) 103   Temp 98.1 F (36.7 C) (Oral)   Resp 19   Ht  (1.575 m)   Wt 104.3 kg   LMP 10/11/2018 (Exact Date)   SpO2 98%   BMI 42.07 kg/m   Physical Exam Vitals signs and nursing note reviewed.  Constitutional:      General: She is not in acute distress.    Appearance: She is well-developed.     Comments: Patient resting comfortably, no acute distress.  HENT:     Head: Normocephalic and atraumatic.  Eyes:     Extraocular Movements: Extraocular  movements intact.     Conjunctiva/sclera: Conjunctivae normal.     Pupils: Pupils are equal, round, and reactive to light.  Neck:     Musculoskeletal: Normal range of motion and neck supple. No neck rigidity.  Cardiovascular:     Rate and Rhythm: Regular rhythm. Tachycardia present.     Heart sounds: No murmur.  Pulmonary:     Effort: Pulmonary effort is normal. No respiratory distress.     Breath sounds: Normal breath sounds.  Abdominal:     Palpations: Abdomen is soft.     Tenderness: There is no abdominal tenderness.  Musculoskeletal: Normal range of motion.        General: No swelling or tenderness.     Right lower leg: No edema.     Left lower leg: No edema.  Skin:    General: Skin is warm and dry.     Capillary Refill: Capillary refill takes less than 2 seconds.  Neurological:     General: No focal deficit present.     Mental Status: She is alert and oriented to person, place, and time. Mental status is at baseline.     Cranial Nerves: No cranial nerve deficit.     Sensory: No sensory deficit.     Motor: No weakness.     Coordination: Coordination normal.     Gait: Gait normal.     Deep Tendon Reflexes: Reflexes normal.     Comments: No neurological deficits  Psychiatric:        Mood and Affect: Mood normal.      ED Treatments / Results  Labs (all labs ordered are listed, but only abnormal results are displayed) Labs Reviewed  COMPREHENSIVE METABOLIC PANEL - Abnormal; Notable for the following components:      Result Value   CO2 18 (*)    Glucose, Bld 119 (*)    Calcium 8.8 (*)    Albumin 3.4 (*)    All other components within normal limits  CBC WITH DIFFERENTIAL/PLATELET - Abnormal; Notable for the following components:   WBC 13.0 (*)    Lymphs Abs 4.3 (*)    All other components within normal limits  D-DIMER, QUANTITATIVE (NOT AT [facility])  TSH  LIPID PANEL  RAPID URINE DRUG SCREEN, HOSP PERFORMED  I-STAT TROPONIN, ED  I-STAT BETA HCG BLOOD, ED ([facility], [facility], [facility]  ONLY)    EKG EKG Interpretation  Date/Time:  Sunday November 10 2018 17:10:01 EST Ventricular Rate:  101 PR Interval:    QRS Duration: 78 QT Interval:  351 QTC Calculation: 455 R Axis:   57 Text Interpretation:  Sinus tachycardia Borderline T abnormalities, inferior leads No previous ECGs available Confirmed by Richardean Canal [phone]) on 11/10/2018 6:58:25 PM   Radiology Ct Head Wo Contrast  Result Date: 11/10/2018 CLINICAL DATA:  Syncope EXAM: CT HEAD WITHOUT  CONTRAST TECHNIQUE: Contiguous axial images were obtained from the base of the skull through the vertex without intravenous contrast. COMPARISON:  09/27/2016 FINDINGS: Brain: No acute intracranial abnormality. Specifically, no hemorrhage, hydrocephalus, mass lesion, acute infarction, or significant intracranial injury. Vascular: No hyperdense vessel or unexpected calcification. Skull: No acute calvarial abnormality. Sinuses/Orbits: Visualized paranasal sinuses and mastoids clear. Orbital soft tissues unremarkable. Other: None IMPRESSION: Normal head CT. Electronically Signed   By: Charlett Nose M.D.   On: 11/10/2018 19:36   Dg Chest Portable 1 View  Result Date: 11/10/2018 CLINICAL DATA:  Dizziness and syncope today. EXAM: PORTABLE CHEST 1 VIEW COMPARISON:  PA and lateral chest 05/09/2006. FINDINGS: Lungs clear. Heart size normal. No pneumothorax or pleural fluid. No acute or focal bony abnormality. IMPRESSION: No active disease. Electronically Signed   By: Drusilla Kanner M.D.   On: 11/10/2018 18:08    Procedures Procedures (including critical care time)  Medications Ordered in ED Medications  lactated ringers bolus 1,000 mL (0 mLs Intravenous Stopped 11/10/18 2228)  sodium chloride 0.9 % bolus 1,000 mL (1,000 mLs Intravenous New Bag/Given 11/10/18 2300)     Initial Impression / Assessment and Plan / ED Course  I have reviewed the triage vital signs and the nursing notes.  Pertinent labs & imaging results that were available  during my care of the patient were reviewed by me and considered in my medical decision making (see chart for details).        33 year old female with syncopal episode after getting up from the couch and walking to the bedroom.  Episode was witnessed by her husband which indicated that she was unconscious for approximately 30 seconds.  Patient had prodromal symptoms of palpitations, room spinning, lightheadedness.  Patient denies any infectious like symptoms such as fever, cough, chills.  Patient denies any headache during the incident consistent with her prior or sudden onset.  Patient on presentation hemodynamically stable, all symptoms resolved, resting comfortably in the bed, hemodynamically stable.  Physical exam positive for sinus tachycardia, stable blood pressure.  Good pulses distally.  No chest pain at this time.  Based on prodromal symptoms will get cardiac laboratory studies as well as electrolyte studies and chest x-ray, thyroid function.  Possible orthostatic etiology however could also be intermittent arrhythmia.  Laboratory studies indicate negative troponin, appropriate thyroid assessment as well as appropriate electrolytes.  D-dimer negative, patient feeling better after fluid hydration here in the emergency department continues to have no symptoms and be at baseline.  Based on age, risk factors as well as negative work-up, negative head CT. In addition patient could have had possible seizure however patient did not have postictal state and no loss of bladder control, patient did not bite her tongue.  Based on consistent sinus tachycardia, will admit to inpatient team for continued work-up with echo and cardiac monitoring.  Inpatient team in agreement with this plan.  The above care was discussed and agreed upon by my attending physician.  Final Clinical Impressions(s) / ED Diagnoses   Final diagnoses:  Syncope and collapse    ED Discharge Orders    None         Dahlia Client, MD 11/11/18 Ivor Reining    Charlynne Pander, MD 11/11/18 [id number]

## 2018-11-11 ENCOUNTER — Observation Stay (HOSPITAL_COMMUNITY): Payer: BLUE CROSS/BLUE SHIELD

## 2018-11-11 ENCOUNTER — Encounter (HOSPITAL_COMMUNITY): Payer: Self-pay | Admitting: Internal Medicine

## 2018-11-11 ENCOUNTER — Observation Stay (HOSPITAL_BASED_OUTPATIENT_CLINIC_OR_DEPARTMENT_OTHER): Payer: BLUE CROSS/BLUE SHIELD

## 2018-11-11 DIAGNOSIS — E872 Acidosis, unspecified: Secondary | ICD-10-CM | POA: Diagnosis present

## 2018-11-11 DIAGNOSIS — K219 Gastro-esophageal reflux disease without esophagitis: Secondary | ICD-10-CM | POA: Diagnosis present

## 2018-11-11 DIAGNOSIS — I361 Nonrheumatic tricuspid (valve) insufficiency: Secondary | ICD-10-CM

## 2018-11-11 DIAGNOSIS — D72829 Elevated white blood cell count, unspecified: Secondary | ICD-10-CM | POA: Diagnosis present

## 2018-11-11 DIAGNOSIS — R55 Syncope and collapse: Secondary | ICD-10-CM | POA: Diagnosis not present

## 2018-11-11 DIAGNOSIS — E785 Hyperlipidemia, unspecified: Secondary | ICD-10-CM | POA: Diagnosis not present

## 2018-11-11 DIAGNOSIS — R079 Chest pain, unspecified: Secondary | ICD-10-CM | POA: Diagnosis present

## 2018-11-11 HISTORY — DX: Acidosis, unspecified: E87.20

## 2018-11-11 HISTORY — DX: Elevated white blood cell count, unspecified: D72.829

## 2018-11-11 HISTORY — DX: Acidosis: E87.2

## 2018-11-11 HISTORY — DX: Gastro-esophageal reflux disease without esophagitis: K21.9

## 2018-11-11 LAB — RAPID URINE DRUG SCREEN, HOSP PERFORMED
Amphetamines: NOT DETECTED
Barbiturates: NOT DETECTED
Benzodiazepines: NOT DETECTED
Cocaine: NOT DETECTED
Opiates: NOT DETECTED
Tetrahydrocannabinol: NOT DETECTED

## 2018-11-11 LAB — URINALYSIS, ROUTINE W REFLEX MICROSCOPIC
Bilirubin Urine: NEGATIVE
Glucose, UA: NEGATIVE mg/dL
Hgb urine dipstick: NEGATIVE
Ketones, ur: NEGATIVE mg/dL
Leukocytes,Ua: NEGATIVE
Nitrite: NEGATIVE
PROTEIN: NEGATIVE mg/dL
Specific Gravity, Urine: 1.02 (ref 1.005–1.030)
pH: 6 (ref 5.0–8.0)

## 2018-11-11 LAB — HEMOGLOBIN A1C
Hgb A1c MFr Bld: 5.6 % (ref 4.8–5.6)
Mean Plasma Glucose: 114.02 mg/dL

## 2018-11-11 LAB — CBC
HCT: 41.4 % (ref 36.0–46.0)
Hemoglobin: 13.3 g/dL (ref 12.0–15.0)
MCH: 29.6 pg (ref 26.0–34.0)
MCHC: 32.1 g/dL (ref 30.0–36.0)
MCV: 92 fL (ref 80.0–100.0)
Platelets: 271 10*3/uL (ref 150–400)
RBC: 4.5 MIL/uL (ref 3.87–5.11)
RDW: 13 % (ref 11.5–15.5)
WBC: 9.8 10*3/uL (ref 4.0–10.5)
nRBC: 0 % (ref 0.0–0.2)

## 2018-11-11 LAB — BASIC METABOLIC PANEL
Anion gap: 9 (ref 5–15)
BUN: 10 mg/dL (ref 6–20)
CHLORIDE: 107 mmol/L (ref 98–111)
CO2: 20 mmol/L — ABNORMAL LOW (ref 22–32)
Calcium: 8.3 mg/dL — ABNORMAL LOW (ref 8.9–10.3)
Creatinine, Ser: 0.73 mg/dL (ref 0.44–1.00)
GFR calc Af Amer: >60 mL/min (ref >60)
GFR calc non Af Amer: >60 mL/min (ref >60)
Glucose, Bld: 118 mg/dL — ABNORMAL HIGH (ref 70–99)
POTASSIUM: 3.5 mmol/L (ref 3.5–5.1)
Sodium: 136 mmol/L (ref 135–145)

## 2018-11-11 LAB — TROPONIN I
Troponin I: <0.03 ng/mL (ref <0.03)
Troponin I: <0.03 ng/mL (ref <0.03)
Troponin I: <0.03 ng/mL (ref <0.03)

## 2018-11-11 LAB — ECHOCARDIOGRAM COMPLETE
Height: 62 in
Weight: 3680 oz

## 2018-11-11 LAB — LIPID PANEL
Cholesterol: 229 mg/dL — ABNORMAL HIGH (ref 0–200)
HDL: 40 mg/dL — AB (ref >40)
LDL Cholesterol: 129 mg/dL — ABNORMAL HIGH (ref 0–99)
Total CHOL/HDL Ratio: 5.7 RATIO
Triglycerides: 299 mg/dL — ABNORMAL HIGH (ref <150)
VLDL: 60 mg/dL — ABNORMAL HIGH (ref 0–40)

## 2018-11-11 LAB — LACTIC ACID, PLASMA: LACTIC ACID, VENOUS: 1.4 mmol/L (ref 0.5–1.9)

## 2018-11-11 MED ORDER — ASPIRIN EC 325 MG PO TBEC
325.0000 mg | DELAYED_RELEASE_TABLET | Freq: Every day | ORAL | Status: DC
  Administered 2018-11-11: 325 mg via ORAL
  Filled 2018-11-11: qty 1

## 2018-11-11 MED ORDER — SODIUM CHLORIDE 0.9% FLUSH
3.0000 mL | Freq: Two times a day (BID) | INTRAVENOUS | Status: DC
  Administered 2018-11-11: 3 mL via INTRAVENOUS

## 2018-11-11 MED ORDER — MORPHINE SULFATE (PF) 2 MG/ML IV SOLN: 2.0000 mg | INTRAVENOUS | Status: DC | PRN

## 2018-11-11 MED ORDER — MORPHINE SULFATE (PF) 2 MG/ML IV SOLN
2.0000 mg | INTRAVENOUS | Status: DC | PRN
  Filled 2018-11-11: qty 1

## 2018-11-11 MED ORDER — ACETAMINOPHEN 650 MG RE SUPP: 650.0000 mg | Freq: Four times a day (QID) | RECTAL | Status: DC | PRN

## 2018-11-11 MED ORDER — CAPSAICIN 0.025 % EX CREA
TOPICAL_CREAM | Freq: Two times a day (BID) | CUTANEOUS | Status: DC
  Filled 2018-11-11: qty 60

## 2018-11-11 MED ORDER — MAGNESIUM 400 MG PO CAPS: 1.0000 | ORAL_CAPSULE | Freq: Every day | ORAL | 1 refills | Status: DC

## 2018-11-11 MED ORDER — ACETAMINOPHEN 325 MG PO TABS
650.0000 mg | ORAL_TABLET | Freq: Four times a day (QID) | ORAL | Status: DC | PRN
  Administered 2018-11-11: 650 mg via ORAL
  Filled 2018-11-11: qty 2

## 2018-11-11 MED ORDER — PANTOPRAZOLE SODIUM 40 MG PO TBEC: 40.0000 mg | DELAYED_RELEASE_TABLET | Freq: Every day | ORAL | Status: DC | PRN

## 2018-11-11 MED ORDER — ONDANSETRON HCL 4 MG PO TABS: 4.0000 mg | ORAL_TABLET | Freq: Four times a day (QID) | ORAL | Status: DC | PRN

## 2018-11-11 MED ORDER — FAMOTIDINE 20 MG PO TABS: 20.0000 mg | ORAL_TABLET | Freq: Every day | ORAL | Status: DC

## 2018-11-11 MED ORDER — ENOXAPARIN SODIUM 40 MG/0.4ML ~~LOC~~ SOLN
40.0000 mg | SUBCUTANEOUS | Status: DC
  Filled 2018-11-11: qty 0.4

## 2018-11-11 MED ORDER — SODIUM CHLORIDE 0.9 % IV SOLN
INTRAVENOUS | Status: DC
  Administered 2018-11-11: 02:00:00 via INTRAVENOUS

## 2018-11-11 MED ORDER — LORATADINE 10 MG PO TABS
10.0000 mg | ORAL_TABLET | Freq: Every day | ORAL | Status: DC
  Filled 2018-11-11 (×2): qty 1

## 2018-11-11 MED ORDER — NITROGLYCERIN 0.4 MG SL SUBL: 0.4000 mg | SUBLINGUAL_TABLET | SUBLINGUAL | Status: DC | PRN

## 2018-11-11 MED ORDER — ONDANSETRON HCL 4 MG/2ML IJ SOLN: 4.0000 mg | Freq: Four times a day (QID) | INTRAMUSCULAR | Status: DC | PRN

## 2018-11-11 NOTE — Evaluation (Addendum)
Occupational Therapy Evaluation Patient Details Name: Audrey Greene MRN: [id number] DOB: 07/07/1986 Today's Date: 11/11/2018    History of Present Illness Audrey Greene is a 33 y.o. female with medical history significant of obesity, hyperlipidemia, GERD, cluster headache, palpitation, who presents with syncope   Clinical Impression   Pt stood EOB with NT upon arrival and pt is independent with ADLs/selfcare and mobility using no AD. Pt reports 5/10 headache and has BP of 130/89 before activity while standing at EOB and 136/93 with activity. No further acute OT is indicated at this time, OT will sign off    Follow Up Recommendations  No OT follow up    Equipment Recommendations  None recommended by OT    Recommendations for Other Services       Precautions / Restrictions Precautions Precautions: None Restrictions Weight Bearing Restrictions: No      Mobility Bed Mobility Overal bed mobility: Independent                Transfers Overall transfer level: Independent Equipment used: None                  Balance Overall balance assessment: No apparent balance deficits (not formally assessed)                                         ADL either performed or assessed with clinical judgement   ADL Overall ADL's : Independent;At baseline                                             Vision Baseline Vision/History: No visual deficits Patient Visual Report: No change from baseline       Perception     Praxis      Pertinent Vitals/Pain Pain Assessment: 0-10 Pain Score: 5  Pain Location: headache Pain Descriptors / Indicators: Aching;Constant Pain Intervention(s): Monitored during session     Hand Dominance Right   Extremity/Trunk Assessment Upper Extremity Assessment Upper Extremity Assessment: Overall WFL for tasks assessed   Lower Extremity Assessment Lower Extremity Assessment: Defer to PT evaluation    Cervical / Trunk Assessment Cervical / Trunk Assessment: Normal   Communication Communication Communication: No difficulties   Cognition Arousal/Alertness: Awake/alert Behavior During Therapy: WFL for tasks assessed/performed Overall Cognitive Status: Within Functional Limits for tasks assessed                                     General Comments       Exercises     Shoulder Instructions      Home Living Family/patient expects to be discharged to:: Private residence Living Arrangements: Spouse/significant other Available Help at Discharge: Family Type of Home: House Home Access: Stairs to enter Secretary/administrator of Steps: 1   Home Layout: One level         Firefighter: Standard     Home Equipment: None          Prior Functioning/Environment Level of Independence: Independent                 OT Problem List: Pain      OT Treatment/Interventions:      OT Goals(Current goals can be found  in the care plan section) Acute Rehab OT Goals Patient Stated Goal: go home OT Goal Formulation: With patient/family  OT Frequency:     Barriers to D/C:    no barriers       Co-evaluation              AM-PAC OT "6 Clicks" Daily Activity     Outcome Measure Help from another person eating meals?: None Help from another person taking care of personal grooming?: None Help from another person toileting, which includes using toliet, bedpan, or urinal?: None Help from another person bathing (including washing, rinsing, drying)?: None Help from another person to put on and taking off regular upper body clothing?: None Help from another person to put on and taking off regular lower body clothing?: None 6 Click Score: 24   End of Session Nurse Communication: Mobility status  Activity Tolerance: Patient tolerated treatment well Patient left: in bed(sitting EOB)  OT Visit Diagnosis: Pain Pain - part of body: (headache)                 Time: 1028-1050 OT Time Calculation (min): 22 min Charges:  OT General Charges $OT Visit: 1 Visit OT Evaluation $OT Eval Low Complexity: 1 Low    Galen Manila 11/11/2018, 10:59 AM

## 2018-11-11 NOTE — Progress Notes (Signed)
Patient given AVS and discharge instructions. Able to ask questions. PIV removed and family updated at bedside. Discharge via wheelchair.

## 2018-11-11 NOTE — Progress Notes (Signed)
  Echocardiogram 2D Echocardiogram has been performed.  Leta Jungling M 11/11/2018, 10:20 AM

## 2018-11-11 NOTE — Progress Notes (Signed)
PT Cancellation Note  Patient Details Name: Audrey Greene MRN: [id number] DOB: 08-16-1986   Cancelled Treatment:    Reason Eval/Treat Not Completed: PT screened, no needs identified, will sign off. PT discussed pt case with OT who states pt is independent with mobility, and VSS throughout OT evaluation. Will sign off at this time, however if needs change, please reconsult.    Marylynn Pearson 11/11/2018, 10:56 AM   Conni Slipper, PT, DPT Acute Rehabilitation Services Pager: [phone] Office: [phone]

## 2018-11-11 NOTE — CV Procedure (Signed)
Echocardiogram not completed patient is going for a MRI.  Leta Jungling RDCS

## 2018-11-11 NOTE — Discharge Summary (Signed)
Physician Discharge Summary  [place] PQD:[id number] DOB: Jan 29, 1986 DOA: 11/10/2018  PCP: Audrey Greene, No Pcp Per  Admit date: 11/10/2018 Discharge date: 11/11/2018  Admitted From: home Discharge disposition: home   Recommendations for Outpatient Follow-Up:   1. Consider event monitor 2. Started magnesium 3. Encouraged hydration 4. Good sleep hygiene    Discharge Diagnosis:   Principal Problem:   Syncope Active Problems:   Chest pain   Hyperlipidemia   GERD (gastroesophageal reflux disease)   Metabolic acidosis   Leukocytosis    Discharge Condition: Improved.  Diet recommendation:  Regular.  Wound care: None.  Code status: Full.   History of Present Illness:   Audrey Greene is a 33 y.o. female with medical history significant of obesity, hyperlipidemia, GERD, cluster headache, palpitation, who presents with syncope.  Audrey Greene states that she started feeling dizzy and heart racing when she was walked to the refrigerator at home at about 4 PM. She also felt room spinning around her. Then, she passed out for about 30 seconds.Episode was witnessed by her husband. No seizure activity or jerking movement noted.  Audrey Greene denies any unilateral weakness, numbness or tingling to extremities.  No facial droop or slurred speech.  Audrey Greene states that she has left-sided chest pain, which is located under left breast area, initially 10 out of 10 in severity, currently 1 out of 10 in severity, nonradiating, sharp.  The pain is not pleuritic, not aggravated by deep breath.  Audrey Greene has nausea, but no vomiting, diarrhea or abdominal pain.  Denies symptoms of UTI.  No fever or chills.  She states that she has mild bilateral ankle edema recently.   Hospital Course by Problem:   Syncope: suspect orthostatic -orthostatics positive from HR increase -needs to stay hydrated Echo done -MRI done-- suspect small abnormalities from migraine -also has poor sleep schedule-- <3 hours  at a time-- works 2 hours from home for 12-13 hour shifts  Migraine -PRN tylenol -added magnesium as well  Hyperlipidemia: pt is not taking med at home -needs outpatient follow up  GERD (gastroesophageal reflux disease): -PPI  Morbid obesity:  Estimated body mass index is 42.07 kg/m as calculated from the following:   Height as of this encounter: 5\' 2"  (1.575 m).   Weight as of this encounter: 104.3 kg.   Leukocytosis:  - Likely due to stress induced to demargination. -no fever      Medical Consultants:      Discharge Exam:   Vitals:   11/11/18 1030 11/11/18 1249  BP:  128/89  Pulse: 88 92  Resp:  18  Temp:  97.6 F (36.4 C)  SpO2:  98%   Vitals:   11/11/18 1025 11/11/18 1027 11/11/18 1030 11/11/18 1249  BP:    128/89  Pulse: 90 88 88 92  Resp:    18  Temp:    97.6 F (36.4 C)  TempSrc:    Oral  SpO2:    98%  Weight:      Height:        General exam: Appears calm and comfortable.   The results of significant diagnostics from this hospitalization (including imaging, microbiology, ancillary and laboratory) are listed below for reference.     Procedures and Diagnostic Studies:   Ct Head Wo Contrast  Result Date: 11/10/2018 CLINICAL DATA:  Syncope EXAM: CT HEAD WITHOUT CONTRAST TECHNIQUE: Contiguous axial images were obtained from the base of the skull through the vertex without intravenous contrast. COMPARISON:  09/27/2016 FINDINGS: Brain:  No acute intracranial abnormality. Specifically, no hemorrhage, hydrocephalus, mass lesion, acute infarction, or significant intracranial injury. Vascular: No hyperdense vessel or unexpected calcification. Skull: No acute calvarial abnormality. Sinuses/Orbits: Visualized paranasal sinuses and mastoids clear. Orbital soft tissues unremarkable. Other: None IMPRESSION: Normal head CT. Electronically Signed   By: Charlett Nose M.D.   On: 11/10/2018 19:36   Mr Brain Wo Contrast  Result Date: 11/11/2018 CLINICAL DATA:   Syncopal episode. Sudden onset of dizziness and heart palpitations/tachycardia yesterday afternoon. EXAM: MRI HEAD WITHOUT CONTRAST TECHNIQUE: Multiplanar, multiecho pulse sequences of the brain and surrounding structures were obtained without intravenous contrast. COMPARISON:  CT head without contrast 11/10/2018 FINDINGS: Brain: A single periventricular T2 hyperintensity adjacent to the left lateral ventricle measures 6 mm maximally. No other significant white matter disease is present. No acute infarct, hemorrhage, or mass lesion is present. The ventricles are of normal size. No significant extraaxial fluid collection is present. The internal auditory canals are within normal limits. The brainstem and cerebellum are within normal limits. Vascular: Flow is present in the major intracranial arteries. Skull and upper cervical spine: The craniocervical junction is normal. Upper cervical spine is within normal limits. Marrow signal is unremarkable. Sinuses/Orbits: The paranasal sinuses and mastoid air cells are clear. The globes and orbits are within normal limits. IMPRESSION: 1. No acute infarct. No acute intracranial abnormality to explain the Audrey Greene's dizziness or syncope. 2. Single left periventricular T2 hyperintensity. While this may be within normal limits, there is a differential diagnosis. The finding is nonspecific but can be seen in the setting of chronic microvascular ischemia, a demyelinating process such as multiple sclerosis, vasculitis, complicated migraine headaches, or as the sequelae of a prior infectious or inflammatory process. Electronically Signed   By: Marin Roberts M.D.   On: 11/11/2018 09:54   Dg Chest Portable 1 View  Result Date: 11/10/2018 CLINICAL DATA:  Dizziness and syncope today. EXAM: PORTABLE CHEST 1 VIEW COMPARISON:  PA and lateral chest 05/09/2006. FINDINGS: Lungs clear. Heart size normal. No pneumothorax or pleural fluid. No acute or focal bony abnormality. IMPRESSION:  No active disease. Electronically Signed   By: Drusilla Kanner M.D.   On: 11/10/2018 18:08     Labs:   Basic Metabolic Panel: Recent Labs  Lab 11/10/18 1810 11/11/18 0611  NA 136 136  K 4.1 3.5  CL 105 107  CO2 18* 20*  GLUCOSE 119* 118*  BUN 11 10  CREATININE 0.72 0.73  CALCIUM 8.8* 8.3*   GFR Estimated Creatinine Clearance: 112.3 mL/min (by C-G formula based on SCr of 0.73 mg/dL). Liver Function Tests: Recent Labs  Lab 11/10/18 1810  AST 17  ALT 20  ALKPHOS 67  BILITOT 0.3  PROT 6.9  ALBUMIN 3.4*   No results for input(s): LIPASE, AMYLASE in the last 168 hours. No results for input(s): AMMONIA in the last 168 hours. Coagulation profile No results for input(s): INR, PROTIME in the last 168 hours.  CBC: Recent Labs  Lab 11/10/18 1810 11/11/18 0611  WBC 13.0* 9.8  NEUTROABS 7.4  --   HGB 14.6 13.3  HCT 44.8 41.4  MCV 93.1 92.0  PLT 301 271   Cardiac Enzymes: Recent Labs  Lab 11/11/18 0031 11/11/18 0611 11/11/18 1149  TROPONINI <0.03 <0.03 <0.03   BNP: Invalid input(s): POCBNP CBG: No results for input(s): GLUCAP in the last 168 hours. D-Dimer Recent Labs    11/10/18 1810  DDIMER <0.27   Hgb A1c Recent Labs    11/11/18 0558  HGBA1C  5.6   Lipid Profile Recent Labs    11/11/18 0558  CHOL 229*  HDL 40*  LDLCALC 129*  TRIG 299*  CHOLHDL 5.7   Thyroid function studies Recent Labs    11/10/18 1816  TSH 3.141   Anemia work up No results for input(s): VITAMINB12, FOLATE, FERRITIN, TIBC, IRON, RETICCTPCT in the last 72 hours. Microbiology No results found for this or any previous visit (from the past 240 hour(s)).   Discharge Instructions:   Discharge Instructions    Diet general   Complete by:  As directed    Discharge instructions   Complete by:  As directed    Establish with PCP -- will need to consider GI referral for EGD if continues to have left sided discomfort Sleep routine Can take over the counter magnesium  before bed -when establishing with PCP will need vitamin D level -will need to address lipids with PCP AS well   Increase activity slowly   Complete by:  As directed      Allergies as of 11/11/2018      Reactions   Penicillins    Has Audrey Greene had a PCN reaction causing immediate rash, facial/tongue/throat swelling, SOB or lightheadedness with hypotension: unknown Has Audrey Greene had a PCN reaction causing severe rash involving mucus membranes or skin necrosis: Unknown Has Audrey Greene had a PCN reaction that required hospitalization: Unknown Has Audrey Greene had a PCN reaction occurring within the last 10 years: No If all of the above answers are "NO", then may proceed with Cephalosporin use.      Medication List    STOP taking these medications   HYDROcodone-acetaminophen 5-325 MG tablet Commonly known as:  NORCO/VICODIN   ibuprofen 800 MG tablet Commonly known as:  ADVIL,MOTRIN   predniSONE 20 MG tablet Commonly known as:  DELTASONE     TAKE these medications   baclofen 10 MG tablet Commonly known as:  LIORESAL Take 10 mg by mouth 3 (three) times daily as needed (back spasms).   cetirizine 10 MG tablet Commonly known as:  ZYRTEC Take 10 mg by mouth daily as needed for allergies.   famotidine 20 MG tablet Commonly known as:  PEPCID Take 1 tablet (20 mg total) by mouth daily. What changed:    when to take this  reasons to take this   Magnesium 400 MG Caps Take 1 tablet by mouth daily.      Follow-up Information    Audrey Greene, No Pcp Per. Schedule an appointment as soon as possible for a visit in 2 days.   Specialty:  General Practice Why:  Please follow-up as an outpatient with your primary care provider.           Time coordinating discharge: 25 min  Signed:  Joseph Art DO  [facility] 11/11/2018, 3:02 PM

## 2018-11-16 LAB — CULTURE, BLOOD (ROUTINE X 2)
CULTURE: NO GROWTH
Culture: NO GROWTH
Special Requests: ADEQUATE
Special Requests: ADEQUATE

## 2019-03-28 ENCOUNTER — Other Ambulatory Visit: Payer: Self-pay | Admitting: Podiatry

## 2019-03-28 ENCOUNTER — Ambulatory Visit (INDEPENDENT_AMBULATORY_CARE_PROVIDER_SITE_OTHER): Payer: BC Managed Care – PPO

## 2019-03-28 ENCOUNTER — Other Ambulatory Visit: Payer: Self-pay

## 2019-03-28 ENCOUNTER — Ambulatory Visit (INDEPENDENT_AMBULATORY_CARE_PROVIDER_SITE_OTHER): Payer: BC Managed Care – PPO | Admitting: Podiatry

## 2019-03-28 ENCOUNTER — Encounter: Payer: Self-pay | Admitting: Podiatry

## 2019-03-28 VITALS — Temp 97.7°F

## 2019-03-28 DIAGNOSIS — M79671 Pain in right foot: Secondary | ICD-10-CM

## 2019-03-28 DIAGNOSIS — L600 Ingrowing nail: Secondary | ICD-10-CM

## 2019-03-28 DIAGNOSIS — M722 Plantar fascial fibromatosis: Secondary | ICD-10-CM

## 2019-03-28 DIAGNOSIS — M79672 Pain in left foot: Secondary | ICD-10-CM | POA: Diagnosis not present

## 2019-03-28 MED ORDER — DICLOFENAC SODIUM 75 MG PO TBEC: 75.0000 mg | DELAYED_RELEASE_TABLET | Freq: Two times a day (BID) | ORAL | 2 refills | Status: DC

## 2019-03-28 NOTE — Progress Notes (Signed)
   Subjective:    Patient ID: Audrey Greene, female    DOB: Dec 27, 1985, 33 y.o.   MRN: [id number]  HPI    Review of Systems  All other systems reviewed and are negative.      Objective:   Physical Exam        Assessment & Plan:

## 2019-03-28 NOTE — Patient Instructions (Signed)

## 2019-04-03 NOTE — Progress Notes (Signed)
Subjective:   Patient ID: Audrey Greene, female   DOB: 33 y.o.   MRN: [id number]   HPI Patient presents stating that the heels have really been bothering her and also she has chronic problems with the big toenails of both feet stating that they get sore and make it hard for her to wear shoe gear and she tries to trim them herself.  States that heels been hurting for the last several months and she does not remember injury and patient does not smoke and likes to be active   Review of Systems  All other systems reviewed and are negative.       Objective:  Physical Exam Vitals signs and nursing note reviewed.  Constitutional:      Appearance: She is well-developed.  Pulmonary:     Effort: Pulmonary effort is normal.  Musculoskeletal: Normal range of motion.  Skin:    General: Skin is warm.  Neurological:     Mental Status: She is alert.     Neurovascular status intact muscle strength is adequate range of motion within normal limits with patient found to have discomfort in the plantar heel bilateral with inflammation fluid buildup and is noted to have incurvated hallux nails both feet that are tender when pressed.  Patient is noted to have good digital perfusion well oriented x3     Assessment:  Acute plantar fasciitis bilateral with inflammation fluid of the medial band and ingrown toenail deformity hallux bilateral     Plan:  H&P discussed both conditions.  At this time I did sterile prep and I injected the plantar fascial bilateral 3 mg Kenalog 5 mg Xylocaine and instructed for physical therapy.  Applied fascial brace bilateral to lift the arch is up and instructed on shoe gear modification and for the ingrown toenails I recommended removal of the borders and I explained procedure to patient and patient wants this done will get done next week.  Patient also at this time is placed on diclofenac 75 mg twice daily  X-rays indicate there is spur formation with no indications of stress  fracture arthritis

## 2019-04-04 ENCOUNTER — Encounter: Payer: Self-pay | Admitting: Podiatry

## 2019-04-04 ENCOUNTER — Ambulatory Visit (INDEPENDENT_AMBULATORY_CARE_PROVIDER_SITE_OTHER): Payer: BC Managed Care – PPO | Admitting: Podiatry

## 2019-04-04 ENCOUNTER — Other Ambulatory Visit: Payer: Self-pay

## 2019-04-04 VITALS — Temp 98.1°F

## 2019-04-04 DIAGNOSIS — L600 Ingrowing nail: Secondary | ICD-10-CM | POA: Diagnosis not present

## 2019-04-04 DIAGNOSIS — M722 Plantar fascial fibromatosis: Secondary | ICD-10-CM

## 2019-04-04 MED ORDER — NEOMYCIN-POLYMYXIN-HC 1 % OT SOLN: OTIC | 1 refills | Status: DC

## 2019-04-04 NOTE — Patient Instructions (Signed)

## 2019-04-10 NOTE — Progress Notes (Signed)
Subjective:   Patient ID: Audrey Greene, female   DOB: 33 y.o.   MRN: [id number]   HPI Patient states that heels were significantly improved and I am starting to get mild discomfort again but overall still better and I want to get these ingrown toenails fixed on both feet   ROS      Objective:  Physical Exam  Neurovascular status intact with patient found to have improvement in heels bilateral with mild discomfort with incurvated hallux nails bilateral that are painful when pressed with no redness or active drainage noted     Assessment:  Ingrown toenail deformity hallux bilateral with plantar fasciitis bilateral that is improving but still present     Plan:  H&P discussed both conditions and for the plantar fasciitis may consider orthotics or other treatment depending on response.  Today we are focusing on the ingrown's and I allowed her to read consent form for correction of ingrown's and explained procedure to patient.  Patient wants surgery and I infiltrated each hallux 60 mg Xylocaine Marcaine mixture under sterile conditions I remove the medial lateral borders exposed matrix and applied phenol 3 applications 30 seconds followed by alcohol lavage and sterile dressing.  Gave instructions on soaks and reappoint and encouraged to leave dressings on 24 hours but take them off earlier if throbbing were to occur and also wrote prescription for drops and encouraged her to call us if she has Any issues

## 2019-04-23 ENCOUNTER — Other Ambulatory Visit: Payer: Self-pay

## 2019-04-23 ENCOUNTER — Encounter: Payer: Self-pay | Admitting: Podiatry

## 2019-04-23 ENCOUNTER — Ambulatory Visit (INDEPENDENT_AMBULATORY_CARE_PROVIDER_SITE_OTHER): Payer: BC Managed Care – PPO | Admitting: Podiatry

## 2019-04-23 VITALS — Temp 98.1°F

## 2019-04-23 DIAGNOSIS — L600 Ingrowing nail: Secondary | ICD-10-CM

## 2019-04-23 DIAGNOSIS — M722 Plantar fascial fibromatosis: Secondary | ICD-10-CM | POA: Diagnosis not present

## 2019-04-23 NOTE — Progress Notes (Signed)
Subjective:   Patient ID: Audrey Greene, female   DOB: 33 y.o.   MRN: [id number]   HPI Patient states that the nails seem to be healing pretty well but she wanted him checked and also she continues to experience heel pain especially when she is on her feet and she does work 12-hour shifts   ROS      Objective:  Physical Exam  Neurovascular status intact with exquisite discomfort plantar heel bilateral still noted only upon deep palpation with improvement from previous but still present with flatfoot deformity bunion deformity and nails which are healing well      Assessment:  Continued plantar fasciitis bilateral that is improved but still present along with bunion deformities bilateral and nails that have white type discoloration     Plan:  H&P discussed all conditions and recommended orthotics to provide foot support and patient is casted for functional type orthotic devices.  Continue soaking the nails but do not recommend further treatment may require further treatment for heels but hopefully orthotics will take care of remainder of the problem signed visit

## 2019-04-25 ENCOUNTER — Ambulatory Visit: Payer: BC Managed Care – PPO | Admitting: Podiatry

## 2019-05-13 ENCOUNTER — Ambulatory Visit: Payer: BC Managed Care – PPO | Admitting: Orthotics

## 2019-05-13 ENCOUNTER — Other Ambulatory Visit: Payer: Self-pay

## 2019-05-13 DIAGNOSIS — L600 Ingrowing nail: Secondary | ICD-10-CM

## 2019-05-13 DIAGNOSIS — M722 Plantar fascial fibromatosis: Secondary | ICD-10-CM

## 2019-05-13 NOTE — Progress Notes (Signed)
Patient came in today to pick up custom made foot orthotics.  The goals were accomplished and the patient reported no dissatisfaction with said orthotics.  Patient was advised of breakin period and how to report any issues. 

## 2019-06-02 ENCOUNTER — Other Ambulatory Visit: Payer: Self-pay | Admitting: Podiatry

## 2019-06-20 ENCOUNTER — Other Ambulatory Visit: Payer: Self-pay

## 2019-06-20 ENCOUNTER — Encounter: Payer: Self-pay | Admitting: Cardiology

## 2019-06-20 ENCOUNTER — Ambulatory Visit (INDEPENDENT_AMBULATORY_CARE_PROVIDER_SITE_OTHER): Payer: BC Managed Care – PPO | Admitting: Cardiology

## 2019-06-20 ENCOUNTER — Ambulatory Visit (INDEPENDENT_AMBULATORY_CARE_PROVIDER_SITE_OTHER): Payer: BC Managed Care – PPO

## 2019-06-20 VITALS — BP 138/100 | HR 97 | Ht 62.0 in | Wt 239.0 lb

## 2019-06-20 DIAGNOSIS — Z1329 Encounter for screening for other suspected endocrine disorder: Secondary | ICD-10-CM

## 2019-06-20 DIAGNOSIS — R079 Chest pain, unspecified: Secondary | ICD-10-CM | POA: Diagnosis not present

## 2019-06-20 DIAGNOSIS — R55 Syncope and collapse: Secondary | ICD-10-CM | POA: Diagnosis not present

## 2019-06-20 DIAGNOSIS — E785 Hyperlipidemia, unspecified: Secondary | ICD-10-CM

## 2019-06-20 DIAGNOSIS — R002 Palpitations: Secondary | ICD-10-CM | POA: Diagnosis not present

## 2019-06-20 NOTE — Patient Instructions (Signed)
Medication Instructions:  Your physician recommends that you continue on your current medications as directed. Please refer to the Current Medication list given to you today.  If you need a refill on your cardiac medications before your next appointment, please call your pharmacy.   Lab work: Your physician recommends that you have a BMP, CBC and TSH drawn today.  If you have labs (blood work) drawn today and your tests are completely normal, you will receive your results only by: Marland Kitchen MyChart Message (if you have MyChart) OR . A paper copy in the mail If you have any lab test that is abnormal or we need to change your treatment, we will call you to review the results.  Testing/Procedures: You had an EKG Performed today  Your physician has recommended that you wear a ZIO monitor. ZIO monitors are medical devices that record the heart's electrical activity. Doctors most often use these monitors to diagnose arrhythmias. Arrhythmias are problems with the speed or rhythm of the heartbeat. The monitor is a small, portable device. You can wear one while you do your normal daily activities. This is usually used to diagnose what is causing palpitations/syncope (passing out).You will wear this device for 14 days.   Follow-Up: At [facility], you and your health needs are our priority.  As part of our continuing mission to provide you with exceptional heart care, we have created designated Provider Care Teams.  These Care Teams include your primary Cardiologist (physician) and Advanced Practice Providers (APPs -  Physician Assistants and Nurse Practitioners) who all work together to provide you with the care you need, when you need it. You will need a follow up appointment in 1 months.

## 2019-06-20 NOTE — Progress Notes (Signed)
Cardiology Office Note:    Date:  06/20/2019   ID:  Audrey Greene, DOB 1986/05/08, MRN [id number]  PCP:  Patient, No Pcp Per  Cardiologist:  Jenean Lindau, MD   Referring MD: Bonnita Nasuti, MD    ASSESSMENT:    No diagnosis found. PLAN:    In order of problems listed above:  1. I discussed my findings with the patient at extensive length and reassured her.  Her blood pressure is elevated but she mentions to me that her blood pressure is fine at home and she has an element of whitecoat coat hypertension.  I asked her to keep a track of it.  Diet was discussed also weight reduction was stressed and risks of obesity explained and she vocalized understanding. 2. Palpitations: We will obtain blood work today including TSH and CBC.  I will do a 2-week ZIO monitor to understand her symptoms.  At this time I am not want to begin her on any medications.  She will be seen in follow-up appointment in a month or earlier if she has any concerns.  She knows to go to the nearest emergency room for any significant issues.  Echocardiogram done earlier this year was reviewed and discussed with the patient.   Medication Adjustments/Labs and Tests Ordered: Current medicines are reviewed at length with the patient today.  Concerns regarding medicines are outlined above.  No orders of the defined types were placed in this encounter.  No orders of the defined types were placed in this encounter.    History of Present Illness:    Audrey Greene is a 33 y.o. female who is being seen today for the evaluation of palpitations and elevated heart rate at the request of Hague, Rosalyn Charters, MD.  Patient is a pleasant 33 year old female.  She has past medical history that is not much significant.  She denies any history of hypertension diabetes mellitus or dyslipidemia.  She mentions to me that earlier this year her heart rate went into the 160s and she went to [facility] and was admitted and released.  No  specific diagnosis was given.  She denies any chest pain orthopnea or PND.  At the time of my evaluation, the patient is alert awake oriented and in no distress.  She is never had any dizzy spell or passing out spell.  She is concerned about her elevated heart rate and therefore she is evaluated here.  At the time of my evaluation, the patient is alert awake oriented and in no distress.  Past Medical History:  Diagnosis Date  . Chest pain   . Cluster headaches   . GERD (gastroesophageal reflux disease) 11/11/2018  . Hyperlipidemia   . Leukocytosis 11/11/2018  . Metabolic acidosis 0/17/5102  . Syncope 11/10/2018  . Syncope and collapse     Past Surgical History:  Procedure Laterality Date  . ABDOMINAL SURGERY    . BACK SURGERY      Current Medications: Current Meds  Medication Sig  . cetirizine (ZYRTEC) 10 MG tablet Take 10 mg by mouth daily as needed for allergies.  Marland Kitchen diclofenac (VOLTAREN) 75 MG EC tablet TAKE 1 TABLET(75 MG) BY MOUTH TWICE DAILY  . famotidine (PEPCID) 20 MG tablet Take 1 tablet (20 mg total) by mouth daily.  . Prenatal Vit-Fe Fumarate-FA (MULTIVITAMIN-PRENATAL) 27-0.8 MG TABS tablet Take 1 tablet by mouth daily at 12 noon.  . Vitamin D, Ergocalciferol, (DRISDOL) 1.25 MG (50000 UT) CAPS capsule TK 1 C PO 3  TIMES A WK     Allergies:   Penicillins   Social History   Socioeconomic History  . Marital status: Married    Spouse name: Not on file  . Number of children: Not on file  . Years of education: Not on file  . Highest education level: Not on file  Occupational History  . Not on file  Social Needs  . Financial resource strain: Not on file  . Food insecurity    Worry: Not on file    Inability: Not on file  . Transportation needs    Medical: Not on file    Non-medical: Not on file  Tobacco Use  . Smoking status: Never Smoker  . Smokeless tobacco: Never Used  Substance and Sexual Activity  . Alcohol use: No  . Drug use: No  . Sexual activity: Not on  file  Lifestyle  . Physical activity    Days per week: Not on file    Minutes per session: Not on file  . Stress: Not on file  Relationships  . Social Musicianconnections    Talks on phone: Not on file    Gets together: Not on file    Attends religious service: Not on file    Active member of club or organization: Not on file    Attends meetings of clubs or organizations: Not on file    Relationship status: Not on file  Other Topics Concern  . Not on file  Social History Narrative  . Not on file     Family History: The patient's family history includes CAD in her father; Hyperlipidemia in her brother, father, and mother; Hypertension in her brother and mother.  ROS:   Please see the history of present illness.    All other systems reviewed and are negative.  EKGs/Labs/Other Studies Reviewed:    The following studies were reviewed today: EKG done reveals sinus rhythm and nonspecific ST-T changes IMPRESSIONS    1. The left ventricle has normal systolic function with an ejection fraction of 60-65%. The cavity size was normal. Left ventricular diastolic Doppler parameters are indeterminate.  2. The right ventricle has normal systolic function. The cavity was normal. There is no increase in right ventricular wall thickness.  3. The mitral valve is normal in structure.  4. The tricuspid valve is normal in structure.  5. The aortic valve is normal in structure. Mild thickening of the aortic valve.  6. The interatrial septum was not assessed.    Recent Labs: 11/10/2018: ALT 20; TSH 3.141 11/11/2018: BUN 10; Creatinine, Ser 0.73; Hemoglobin 13.3; Platelets 271; Potassium 3.5; Sodium 136  Recent Lipid Panel    Component Value Date/Time   CHOL 229 (H) 11/11/2018 0558   TRIG 299 (H) 11/11/2018 0558   HDL 40 (L) 11/11/2018 0558   CHOLHDL 5.7 11/11/2018 0558   VLDL 60 (H) 11/11/2018 0558   LDLCALC 129 (H) 11/11/2018 0558    Physical Exam:    VS:  BP (!) 138/100 (BP Location:  Left Arm, Patient Position: Sitting, Cuff Size: Large)   Pulse 97   Ht 5\' 2"  (1.575 m)   Wt 239 lb (108.4 kg)   SpO2 97%   BMI 43.71 kg/m     Wt Readings from Last 3 Encounters:  06/20/19 239 lb (108.4 kg)  11/10/18 230 lb (104.3 kg)  01/03/18 225 lb (102.1 kg)     GEN: Patient is in no acute distress HEENT: Normal NECK: No JVD; No carotid bruits LYMPHATICS: No  lymphadenopathy CARDIAC: S1 S2 regular, 2/6 systolic murmur at the apex. RESPIRATORY:  Clear to auscultation without rales, wheezing or rhonchi  ABDOMEN: Soft, non-tender, non-distended MUSCULOSKELETAL:  No edema; No deformity  SKIN: Warm and dry NEUROLOGIC:  Alert and oriented x 3 PSYCHIATRIC:  Normal affect    Signed, Garwin Brothers, MD  06/20/2019 2:33 PM    [facility]

## 2019-06-21 LAB — CBC
Hematocrit: 43.1 % (ref 34.0–46.6)
Hemoglobin: 14.5 g/dL (ref 11.1–15.9)
MCH: 29.9 pg (ref 26.6–33.0)
MCHC: 33.6 g/dL (ref 31.5–35.7)
MCV: 89 fL (ref 79–97)
Platelets: 329 10*3/uL (ref 150–450)
RBC: 4.85 x10E6/uL (ref 3.77–5.28)
RDW: 13.2 % (ref 11.7–15.4)
WBC: 14.2 10*3/uL — ABNORMAL HIGH (ref 3.4–10.8)

## 2019-06-21 LAB — BASIC METABOLIC PANEL
BUN/Creatinine Ratio: 18 (ref 9–23)
BUN: 15 mg/dL (ref 6–20)
CO2: 23 mmol/L (ref 20–29)
Calcium: 9.2 mg/dL (ref 8.7–10.2)
Chloride: 100 mmol/L (ref 96–106)
Creatinine, Ser: 0.83 mg/dL (ref 0.57–1.00)
GFR calc Af Amer: 106 mL/min/{1.73_m2} (ref >59)
GFR calc non Af Amer: 92 mL/min/{1.73_m2} (ref >59)
Glucose: 74 mg/dL (ref 65–99)
Potassium: 4.2 mmol/L (ref 3.5–5.2)
Sodium: 139 mmol/L (ref 134–144)

## 2019-06-21 LAB — TSH: TSH: 2.98 u[IU]/mL (ref 0.450–4.500)

## 2019-06-23 ENCOUNTER — Telehealth: Payer: Self-pay | Admitting: *Deleted

## 2019-06-23 ENCOUNTER — Encounter: Payer: Self-pay | Admitting: *Deleted

## 2019-06-23 NOTE — Telephone Encounter (Signed)
-----   Message from Rajan R Revankar, MD sent at 06/21/2019  4:53 PM EDT ----- The results of the study is unremarkable. Please inform patient. I will discuss in detail at next appointment. Cc  primary care/referring physician Rajan R Revankar, MD 06/21/2019 4:53 PM 

## 2019-06-23 NOTE — Telephone Encounter (Signed)
Telephone call to patient. Left message that labs were unremarkable and to call with any questions. Copy sent to PCP 

## 2019-07-24 ENCOUNTER — Ambulatory Visit: Payer: BC Managed Care – PPO | Admitting: Cardiology

## 2019-07-28 ENCOUNTER — Encounter (HOSPITAL_COMMUNITY): Payer: Self-pay

## 2019-07-28 ENCOUNTER — Emergency Department (HOSPITAL_COMMUNITY)
Admission: EM | Admit: 2019-07-28 | Discharge: 2019-07-29 | Disposition: A | Discharge status: Home / Self Care | Payer: BC Managed Care – PPO | Attending: Emergency Medicine | Admitting: Emergency Medicine

## 2019-07-28 ENCOUNTER — Other Ambulatory Visit: Payer: Self-pay

## 2019-07-28 DIAGNOSIS — R110 Nausea: Secondary | ICD-10-CM | POA: Diagnosis not present

## 2019-07-28 DIAGNOSIS — R140 Abdominal distension (gaseous): Secondary | ICD-10-CM | POA: Diagnosis not present

## 2019-07-28 DIAGNOSIS — K5900 Constipation, unspecified: Secondary | ICD-10-CM | POA: Diagnosis not present

## 2019-07-28 DIAGNOSIS — Z79899 Other long term (current) drug therapy: Secondary | ICD-10-CM | POA: Insufficient documentation

## 2019-07-28 DIAGNOSIS — R1011 Right upper quadrant pain: Secondary | ICD-10-CM | POA: Insufficient documentation

## 2019-07-28 DIAGNOSIS — R109 Unspecified abdominal pain: Secondary | ICD-10-CM

## 2019-07-28 LAB — URINALYSIS, ROUTINE W REFLEX MICROSCOPIC
Bilirubin Urine: NEGATIVE
Glucose, UA: NEGATIVE mg/dL
Hgb urine dipstick: NEGATIVE
Ketones, ur: NEGATIVE mg/dL
Leukocytes,Ua: NEGATIVE
Nitrite: NEGATIVE
Protein, ur: NEGATIVE mg/dL
Specific Gravity, Urine: 1.027 (ref 1.005–1.030)
pH: 5 (ref 5.0–8.0)

## 2019-07-28 LAB — COMPREHENSIVE METABOLIC PANEL
ALT: 30 U/L (ref 0–44)
AST: 19 U/L (ref 15–41)
Albumin: 3.7 g/dL (ref 3.5–5.0)
Alkaline Phosphatase: 69 U/L (ref 38–126)
Anion gap: 12 (ref 5–15)
BUN: 10 mg/dL (ref 6–20)
CO2: 21 mmol/L — ABNORMAL LOW (ref 22–32)
Calcium: 9 mg/dL (ref 8.9–10.3)
Chloride: 104 mmol/L (ref 98–111)
Creatinine, Ser: 0.79 mg/dL (ref 0.44–1.00)
GFR calc Af Amer: >60 mL/min (ref >60)
GFR calc non Af Amer: >60 mL/min (ref >60)
Glucose, Bld: 138 mg/dL — ABNORMAL HIGH (ref 70–99)
Potassium: 3.5 mmol/L (ref 3.5–5.1)
Sodium: 137 mmol/L (ref 135–145)
Total Bilirubin: 0.2 mg/dL — ABNORMAL LOW (ref 0.3–1.2)
Total Protein: 6.9 g/dL (ref 6.5–8.1)

## 2019-07-28 LAB — I-STAT BETA HCG BLOOD, ED (MC, WL, AP ONLY): I-stat hCG, quantitative: <5 m[IU]/mL (ref <5)

## 2019-07-28 LAB — CBC
HCT: 42.3 % (ref 36.0–46.0)
Hemoglobin: 13.9 g/dL (ref 12.0–15.0)
MCH: 30.7 pg (ref 26.0–34.0)
MCHC: 32.9 g/dL (ref 30.0–36.0)
MCV: 93.4 fL (ref 80.0–100.0)
Platelets: 287 10*3/uL (ref 150–400)
RBC: 4.53 MIL/uL (ref 3.87–5.11)
RDW: 13 % (ref 11.5–15.5)
WBC: 15.1 10*3/uL — ABNORMAL HIGH (ref 4.0–10.5)
nRBC: 0 % (ref 0.0–0.2)

## 2019-07-28 LAB — LIPASE, BLOOD: Lipase: 41 U/L (ref 11–51)

## 2019-07-28 MED ORDER — SODIUM CHLORIDE 0.9% FLUSH
3.0000 mL | Freq: Once | INTRAVENOUS | Status: AC
  Administered 2019-07-28: 3 mL via INTRAVENOUS

## 2019-07-28 NOTE — ED Triage Notes (Signed)
Pt reports right sided abd pain that radiates to her back and shoulder blades, nausea, intermittent constipation, abd swelling, fatigue for the past week. Pt sent here from UC to rule out appendicitis. Pt a.o, nad noted

## 2019-07-29 ENCOUNTER — Emergency Department (HOSPITAL_COMMUNITY): Payer: BC Managed Care – PPO

## 2019-07-29 ENCOUNTER — Telehealth: Payer: Self-pay

## 2019-07-29 ENCOUNTER — Encounter (HOSPITAL_COMMUNITY): Payer: Self-pay | Admitting: Radiology

## 2019-07-29 DIAGNOSIS — R55 Syncope and collapse: Secondary | ICD-10-CM

## 2019-07-29 MED ORDER — FENTANYL CITRATE (PF) 100 MCG/2ML IJ SOLN
50.0000 ug | INTRAMUSCULAR | Status: DC | PRN
  Administered 2019-07-29: 50 ug via INTRAVENOUS
  Filled 2019-07-29: qty 2

## 2019-07-29 MED ORDER — IOHEXOL 300 MG/ML  SOLN
100.0000 mL | Freq: Once | INTRAMUSCULAR | Status: AC | PRN
  Administered 2019-07-29: 100 mL via INTRAVENOUS

## 2019-07-29 MED ORDER — ONDANSETRON 4 MG PO TBDP: ORAL_TABLET | ORAL | 0 refills | Status: DC

## 2019-07-29 NOTE — Telephone Encounter (Signed)
Left message for patient to call office concerning monitor results.

## 2019-07-29 NOTE — ED Provider Notes (Signed)
[facility] DEPARTMENT Provider Note   CSN: [id number] Arrival date & time: 07/28/19  2018     History   Chief Complaint Chief Complaint  Patient presents with  . Nausea  . Abdominal Pain    HPI Audrey Greene is a 33 y.o. female.     Patient presents with gradually worsening intermittent right-sided abdominal pain with mild radiation to her back and shoulder.  Patient's had mild constipation and feeling bloated as well.  Patient sent over from urgent care for further work-up for possible appendicitis.  No history of gallbladder or appendicitis in the past.  Patient has history of reflux.     Past Medical History:  Diagnosis Date  . Chest pain   . Cluster headaches   . GERD (gastroesophageal reflux disease) 11/11/2018  . Hyperlipidemia   . Leukocytosis 11/11/2018  . Metabolic acidosis 10/18/8655  . Syncope 11/10/2018  . Syncope and collapse     Patient Active Problem List   Diagnosis Date Noted  . Palpitations 06/20/2019  . GERD (gastroesophageal reflux disease) 11/11/2018  . Metabolic acidosis 84/69/6295  . Leukocytosis 11/11/2018  . Syncope and collapse   . Chest pain   . Hyperlipidemia   . Syncope 11/10/2018    Past Surgical History:  Procedure Laterality Date  . ABDOMINAL SURGERY    . BACK SURGERY       OB History   No obstetric history on file.      Home Medications    Prior to Admission medications   Medication Sig Start Date End Date Taking? Authorizing Provider  cetirizine (ZYRTEC) 10 MG tablet Take 10 mg by mouth daily as needed for allergies.    [provider]  diclofenac (VOLTAREN) 75 MG EC tablet TAKE 1 TABLET(75 MG) BY MOUTH TWICE DAILY 06/02/19   Wallene Huh, DPM  famotidine (PEPCID) 20 MG tablet Take 1 tablet (20 mg total) by mouth daily. 11/11/18   Geradine Girt, DO  ondansetron (ZOFRAN ODT) 4 MG disintegrating tablet 4mg  ODT q4 hours prn nausea/vomit 07/29/19   Elnora Morrison, MD  Prenatal  Vit-Fe Fumarate-FA (MULTIVITAMIN-PRENATAL) 27-0.8 MG TABS tablet Take 1 tablet by mouth daily at 12 noon.    [provider]  Vitamin D, Ergocalciferol, (DRISDOL) 1.25 MG (50000 UT) CAPS capsule TK 1 C PO 3 TIMES A WK 06/06/19   [provider]    Family History Family History  Problem Relation Age of Onset  . Hypertension Mother   . Hyperlipidemia Mother   . CAD Father   . Hyperlipidemia Father   . Hypertension Brother   . Hyperlipidemia Brother     Social History Social History   Tobacco Use  . Smoking status: Never Smoker  . Smokeless tobacco: Never Used  Substance Use Topics  . Alcohol use: No  . Drug use: No     Allergies   Penicillins   Review of Systems Review of Systems  Constitutional: Positive for fatigue. Negative for chills and fever.  HENT: Negative for congestion.   Eyes: Negative for visual disturbance.  Respiratory: Negative for shortness of breath.   Cardiovascular: Negative for chest pain.  Gastrointestinal: Positive for abdominal pain and nausea. Negative for vomiting.  Genitourinary: Negative for dysuria and flank pain.  Musculoskeletal: Negative for back pain, neck pain and neck stiffness.  Skin: Negative for rash.  Neurological: Negative for light-headedness and headaches.     Physical Exam Updated Vital Signs BP 123/75 (BP Location: Right Arm)  Pulse 86   Temp 98 F (36.7 C) (Oral)   Resp 16   Ht 5\' 2"  (1.575 m)   Wt 104.3 kg   LMP 06/28/2019   SpO2 98%   BMI 42.07 kg/m   Physical Exam Vitals signs and nursing note reviewed.  Constitutional:      Appearance: She is well-developed.  HENT:     Head: Normocephalic and atraumatic.  Eyes:     General:        Right eye: No discharge.        Left eye: No discharge.     Conjunctiva/sclera: Conjunctivae normal.  Neck:     Musculoskeletal: Normal range of motion and neck supple.     Trachea: No tracheal deviation.  Cardiovascular:     Rate and Rhythm: Normal  rate and regular rhythm.  Pulmonary:     Effort: Pulmonary effort is normal.     Breath sounds: Normal breath sounds.  Abdominal:     General: There is no distension.     Palpations: Abdomen is soft.     Tenderness: There is abdominal tenderness in the right upper quadrant and right lower quadrant. There is no guarding.  Skin:    General: Skin is warm.     Findings: No rash.  Neurological:     Mental Status: She is alert and oriented to person, place, and time.      ED Treatments / Results  Labs (all labs ordered are listed, but only abnormal results are displayed) Labs Reviewed  COMPREHENSIVE METABOLIC PANEL - Abnormal; Notable for the following components:      Result Value   CO2 21 (*)    Glucose, Bld 138 (*)    Total Bilirubin 0.2 (*)    All other components within normal limits  CBC - Abnormal; Notable for the following components:   WBC 15.1 (*)    All other components within normal limits  URINALYSIS, ROUTINE W REFLEX MICROSCOPIC - Abnormal; Notable for the following components:   APPearance HAZY (*)    All other components within normal limits  LIPASE, BLOOD  I-STAT BETA HCG BLOOD, ED ([facility], [facility], [facility] ONLY)    EKG None  Radiology Ct Abdomen Pelvis W Contrast  Result Date: 07/29/2019 CLINICAL DATA:  Right-sided abdominal pain. Nausea and constipation. Symptoms for 1 week. Initial encounter. EXAM: CT ABDOMEN AND PELVIS WITH CONTRAST TECHNIQUE: Multidetector CT imaging of the abdomen and pelvis was performed using the standard protocol following bolus administration of intravenous contrast. CONTRAST:  100mL OMNIPAQUE IOHEXOL 300 MG/ML  SOLN COMPARISON:  Abdominal ultrasound 05/29/2019. FINDINGS: Lower chest: Lung bases are clear without focal nodule, mass, or airspace disease. The heart size is normal. No significant pleural or pericardial effusion is present. Hepatobiliary: There is diffuse fatty infiltration of the liver. No focal lesions are present. The common bile  duct and gallbladder are normal. Pancreas: Unremarkable. No pancreatic ductal dilatation or surrounding inflammatory changes. Spleen: Normal in size without focal abnormality. Adrenals/Urinary Tract: Adrenal glands are normal bilaterally. Kidneys and ureters are within normal limits. No focal mass lesion or obstruction is present. The urinary bladder is within normal limits. Stomach/Bowel: The stomach and duodenum are within normal limits. The small bowel is unremarkable. Terminal ileum is within normal limits. The appendix is visualized and normal, posterior to the cecum. The ascending and transverse colon are within normal limits. The descending and sigmoid colon are normal. Vascular/Lymphatic: No significant vascular findings are present. No enlarged abdominal or pelvic lymph nodes.  Reproductive: Uterus and bilateral adnexa are unremarkable. Peripherally enhancing follicle in the right ovary may represent a recently ruptured follicle. There is no significant free fluid or other inflammatory changes about the ovary. This is a normal finding. Other: No abdominal wall hernia or abnormality. No abdominopelvic ascites. Musculoskeletal: Lumbar fusion at L4-5 and L5-S1 is noted. Skeleton is otherwise unremarkable. No focal lytic or blastic lesions are present. Hips are located and within normal limits. IMPRESSION: 1. No acute or focal lesion to explain the patient's abdominal pain. The appendix and bowel within normal limits. Electronically Signed   By: Marin Roberts M.D.   On: 07/29/2019 04:29   US Abdomen Limited  Result Date: 07/29/2019 CLINICAL DATA:  Right-sided abdominal pain for 1 week. EXAM: ULTRASOUND ABDOMEN LIMITED RIGHT UPPER QUADRANT COMPARISON:  CT of the abdomen and pelvis 07/29/2019 FINDINGS: Gallbladder: No gallstones or wall thickening visualized. No sonographic Murphy sign noted by sonographer. Common bile duct: Diameter: 2.2 mm, within normal limits Liver: The liver is diffusely  echogenic. No focal lesions are present. Portal vein is patent on color Doppler imaging with normal direction of blood flow towards the liver. Other: None. IMPRESSION: 1. No acute abnormality. 2. Hepatic steatosis. Electronically Signed   By: Marin Roberts M.D.   On: 07/29/2019 04:40    Procedures Procedures (including critical care time)  Medications Ordered in ED Medications  fentaNYL (SUBLIMAZE) injection 50 mcg (50 mcg Intravenous Given 07/29/19 0355)  sodium chloride flush (NS) 0.9 % injection 3 mL (3 mLs Intravenous Given 07/28/19 2100)  iohexol (OMNIPAQUE) 300 MG/ML solution 100 mL (100 mLs Intravenous Contrast Given 07/29/19 0401)     Initial Impression / Assessment and Plan / ED Course  I have reviewed the triage vital signs and the nursing notes.  Pertinent labs & imaging results that were available during my care of the patient were reviewed by me and considered in my medical decision making (see chart for details).       Patient presents with right-sided abdominal pain concerning for possible appendicitis versus gallbladder pathology.  Plan for CT scan and ultrasound.  Blood work reviewed overall unremarkable except for leukocytosis 15,000.  Pain meds as needed.  CT scan results reviewed no acute findings, normal appendix.  Ultrasound reviewed and results reviewed showing no signs of cholecystitis or cholelithiasis.  With recurrent pain and intermittent radiation to the right shoulder this still could be the gallbladder and patient will need outpatient HIDA scan and further evaluation from primary doctor. Ultrasound results reviewed no acute findings except hepatic steatosis. Results and differential diagnosis were discussed with the patient/parent/guardian. Xrays were independently reviewed by myself.  Close follow up outpatient was discussed, comfortable with the plan.   Medications  fentaNYL (SUBLIMAZE) injection 50 mcg (50 mcg Intravenous Given 07/29/19 0355)   sodium chloride flush (NS) 0.9 % injection 3 mL (3 mLs Intravenous Given 07/28/19 2100)  iohexol (OMNIPAQUE) 300 MG/ML solution 100 mL (100 mLs Intravenous Contrast Given 07/29/19 0401)    Vitals:   07/28/19 2047 07/28/19 2052 07/29/19 0054  BP: (!) 137/100  123/75  Pulse: 95  86  Resp: 18  16  Temp: 98.4 F (36.9 C)  98 F (36.7 C)  TempSrc: Oral  Oral  SpO2: 97%  98%  Weight: 104.3 kg 104.3 kg   Height:  (1.575 m)  (1.575 m)     Final diagnoses:  Right sided abdominal pain     Final Clinical Impressions(s) / ED Diagnoses   Final  diagnoses:  Right sided abdominal pain    ED Discharge Orders         Ordered    ondansetron (ZOFRAN ODT) 4 MG disintegrating tablet     07/29/19 0437           Blane Ohara, MD 07/29/19 [phone]

## 2019-07-29 NOTE — Telephone Encounter (Signed)
-----   Message from Jenean Lindau, MD sent at 07/23/2019  3:00 PM EST ----- Mildly abnormal I would like to get her Chem-7 and a magnesium level if no if one has not been done recently. Jenean Lindau, MD 07/23/2019 3:00 PM

## 2019-07-29 NOTE — Discharge Instructions (Signed)
Follow-up with your primary doctor to discuss further evaluation which may include HIDA scan to check the functionality of your gallbladder.  Return for fevers, persistent vomiting or worsening or new symptoms. Take Zofran as needed for nausea and vomiting.  Avoid fatty foods until you see your doctor.

## 2019-07-30 NOTE — Telephone Encounter (Signed)
Left 2nd message for patient to call office for results. 

## 2019-07-30 NOTE — Telephone Encounter (Signed)
Which labs are you referring to I mean the date

## 2019-08-06 NOTE — Telephone Encounter (Signed)
3rd message for patient to call office concerning results.

## 2019-08-07 NOTE — Telephone Encounter (Signed)
I reviewed them and they look good.  She will need to come in for a magnesium level only.

## 2019-08-13 NOTE — Telephone Encounter (Signed)
Left vm for patient to stop by office for repeat labs. Orders entered.

## 2019-08-13 NOTE — Addendum Note (Signed)
Addended by: Beckey Rutter on: 08/13/2019 10:47 AM   Modules accepted: Orders

## 2019-09-15 ENCOUNTER — Other Ambulatory Visit: Payer: Self-pay | Admitting: Podiatry

## 2019-09-30 ENCOUNTER — Other Ambulatory Visit (HOSPITAL_COMMUNITY): Payer: Self-pay | Admitting: Gastroenterology

## 2019-09-30 ENCOUNTER — Other Ambulatory Visit: Payer: Self-pay | Admitting: Gastroenterology

## 2019-09-30 DIAGNOSIS — R109 Unspecified abdominal pain: Secondary | ICD-10-CM

## 2019-10-10 ENCOUNTER — Other Ambulatory Visit: Payer: Self-pay

## 2019-10-10 ENCOUNTER — Ambulatory Visit (HOSPITAL_COMMUNITY)
Admission: RE | Admit: 2019-10-10 | Discharge: 2019-10-10 | Disposition: A | Discharge status: Home / Self Care | Payer: BC Managed Care – PPO | Source: Ambulatory Visit | Attending: Gastroenterology | Admitting: Gastroenterology

## 2019-10-10 DIAGNOSIS — R109 Unspecified abdominal pain: Secondary | ICD-10-CM | POA: Diagnosis present

## 2019-10-10 MED ORDER — TECHNETIUM TC 99M MEBROFENIN IV KIT
5.5000 | PACK | Freq: Once | INTRAVENOUS | Status: AC | PRN
  Administered 2019-10-10: 5.5 via INTRAVENOUS

## 2019-10-21 ENCOUNTER — Other Ambulatory Visit (HOSPITAL_COMMUNITY): Payer: Self-pay | Admitting: General Surgery

## 2019-10-21 ENCOUNTER — Other Ambulatory Visit: Payer: Self-pay | Admitting: General Surgery

## 2019-10-21 DIAGNOSIS — R112 Nausea with vomiting, unspecified: Secondary | ICD-10-CM

## 2019-11-05 ENCOUNTER — Other Ambulatory Visit: Payer: Self-pay

## 2019-11-05 ENCOUNTER — Encounter (HOSPITAL_COMMUNITY)
Admission: RE | Admit: 2019-11-05 | Discharge: 2019-11-05 | Disposition: A | Discharge status: Home / Self Care | Payer: BC Managed Care – PPO | Source: Ambulatory Visit | Attending: General Surgery | Admitting: General Surgery

## 2019-11-05 DIAGNOSIS — R112 Nausea with vomiting, unspecified: Secondary | ICD-10-CM | POA: Insufficient documentation

## 2019-11-05 MED ORDER — TECHNETIUM TC 99M SULFUR COLLOID
2.2000 | Freq: Once | INTRAVENOUS | Status: AC | PRN
  Administered 2019-11-05: 08:00:00 2.2 via INTRAVENOUS

## 2020-01-27 IMAGING — MR MR HEAD W/O CM
9 of 11 series · 33 of 48 positions shown · non-contrast
Comparison: CT head without contrast 11/10/2018

CLINICAL DATA: Syncopal episode. Sudden onset of dizziness and
heart palpitations/tachycardia yesterday afternoon.

EXAM:
MRI HEAD WITHOUT CONTRAST
TECHNIQUE: Multiplanar, multiecho pulse sequences of the brain and surrounding
structures were obtained without intravenous contrast.

[Series 3: DWI · axial · 3.0mm · 0.94mm/px · z∈[-45,+102]mm · 7 of 100 slices shown (1 of 2)]
[im 1/100]
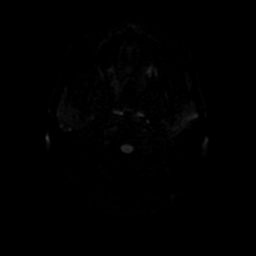
[im 17/100]
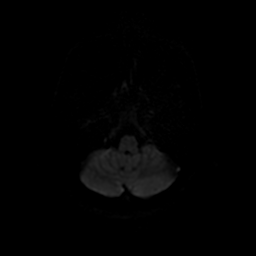
[im 34/100]
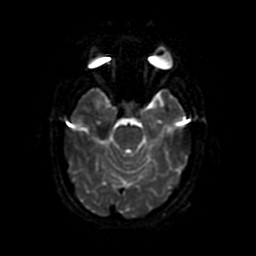
[im 50/100]
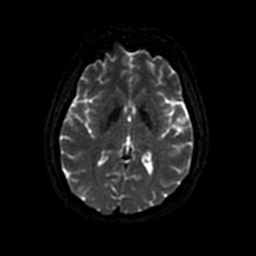
[im 67/100]
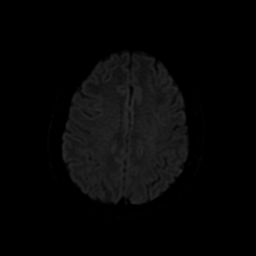
[im 83/100]
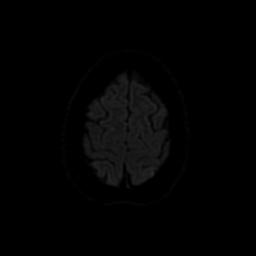
[im 100/100]
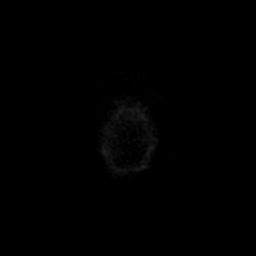

[Series 4: DWI · coronal · 4.0mm · 0.94mm/px · 6 of 70 slices shown (2 of 2)]
[im 1/70]
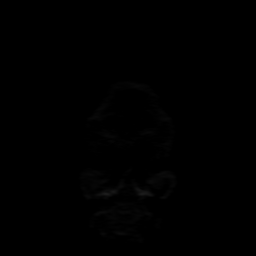
[im 14/70]
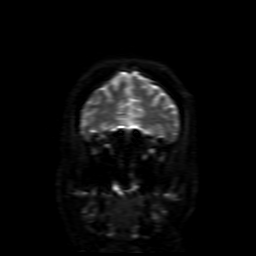
[im 28/70]
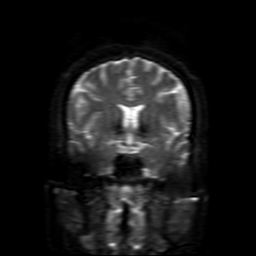
[im 42/70]
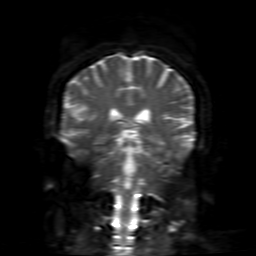
[im 56/70]
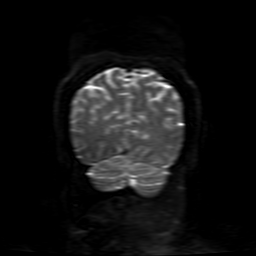
[im 70/70]
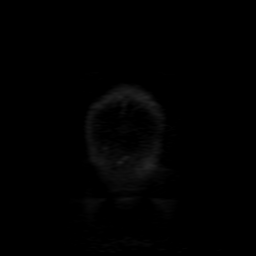

[Series 5: FLAIR · sagittal · 5.0mm · 0.47mm/px · 2 of 24 slices shown (1 of 2)]
[im 1/24]
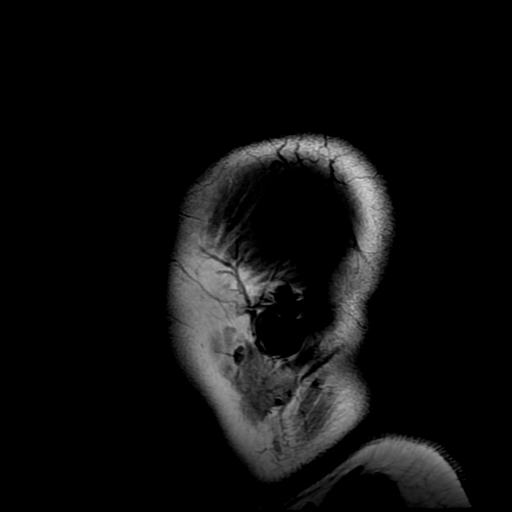
[im 24/24]
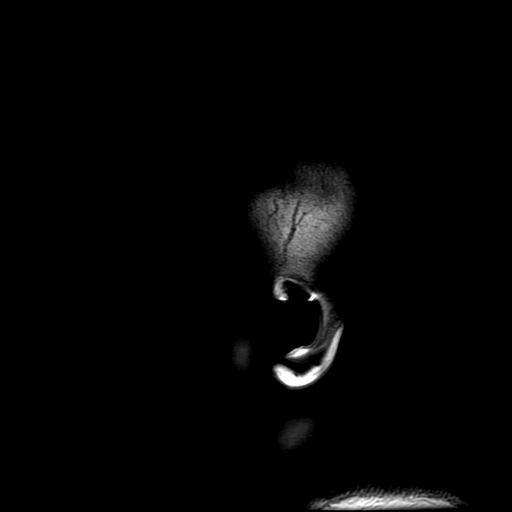

[Series 6: FLAIR · axial · 3.0mm · 0.47mm/px · z∈[-49,+94]mm · 2 of 25 slices shown (2 of 2)]
[im 1/25]
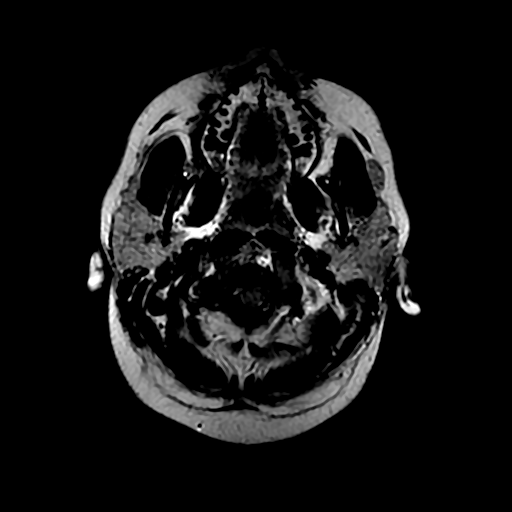
[im 25/25]
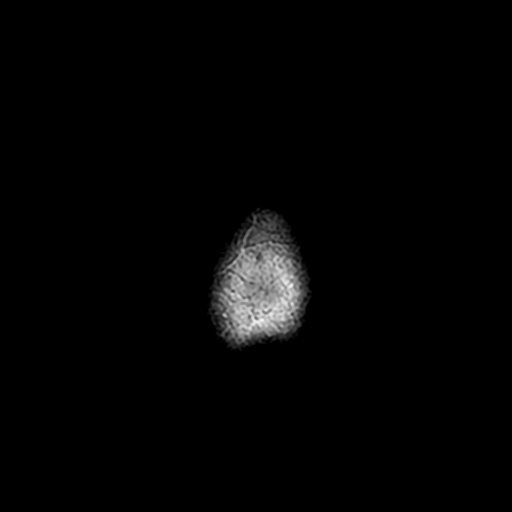

[Series 7: T2 · axial · 5.0mm · 0.47mm/px · z∈[-50,+94]mm · 2 of 25 slices shown (1 of 2)]
[im 1/25]
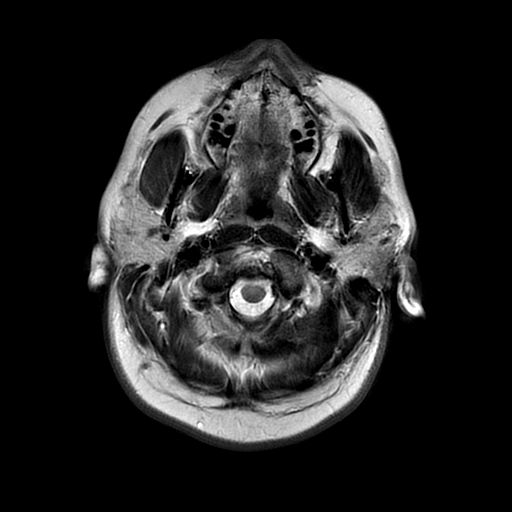
[im 25/25]
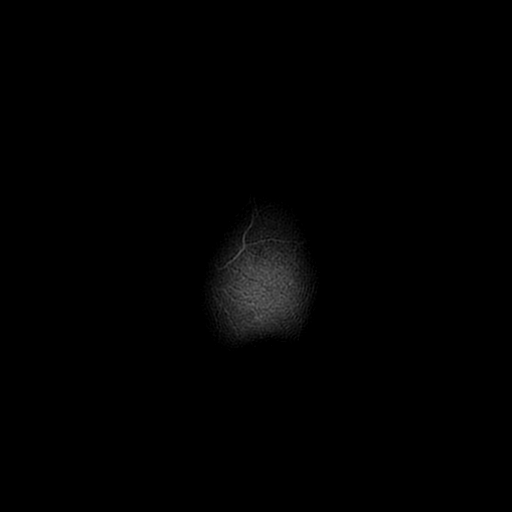

[Series 8: (person_name) · axial · 3.0mm · 0.47mm/px · z∈[-42,+42]mm · 5 of 100 slices shown]
[im 1/100]
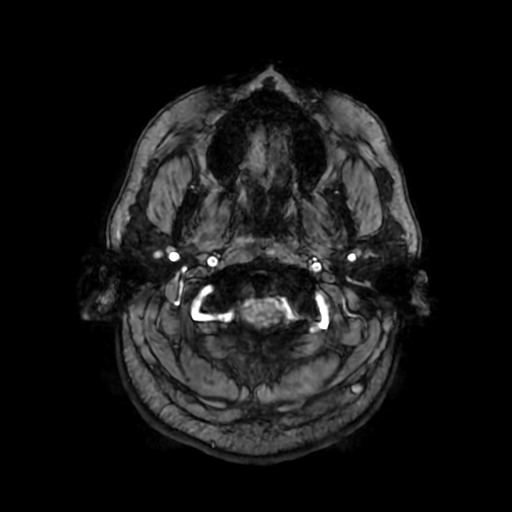
[im 15/100]
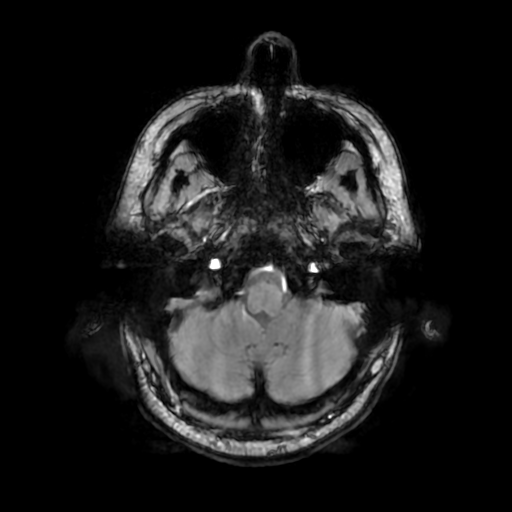
[im 29/100]
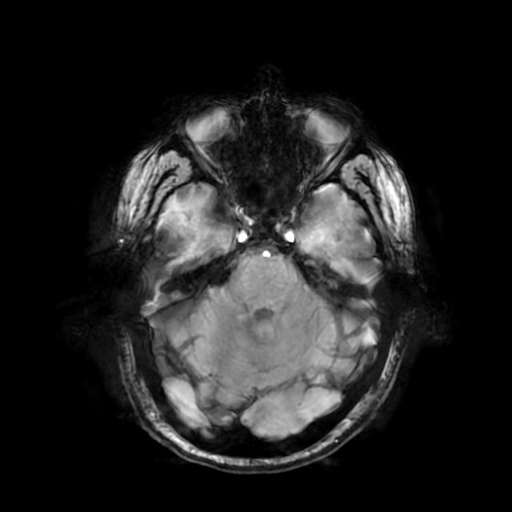
[im 43/100]
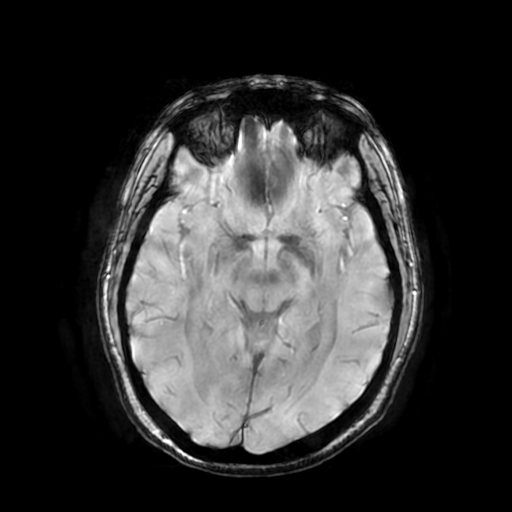
[im 57/100]
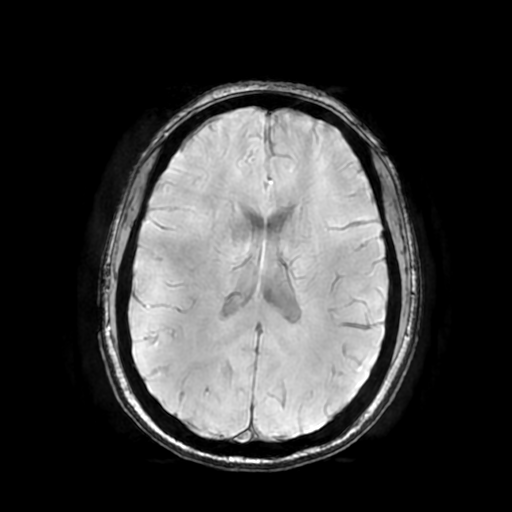

[Series 10: T2 · coronal · 5.0mm · 0.39mm/px · 2 of 28 slices shown (2 of 2)]
[im 1/28]
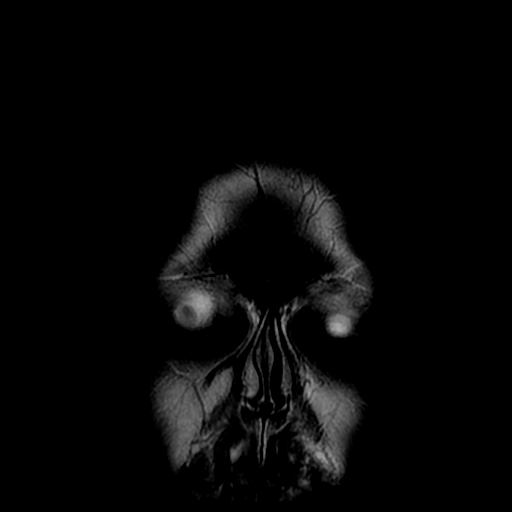
[im 28/28]
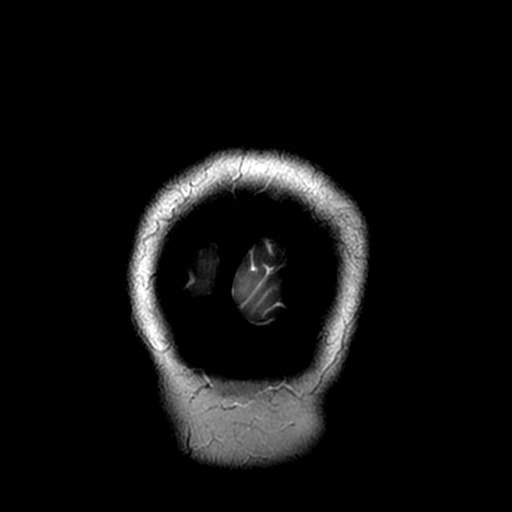

[Series 350: ADC · axial · 3.0mm · 0.94mm/px · z∈[-45,+102]mm · 4 of 50 slices shown (1 of 2)]
[im 1/50]
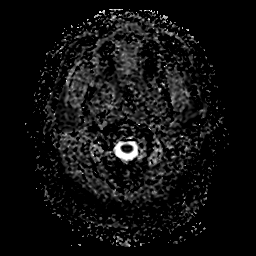
[im 17/50]
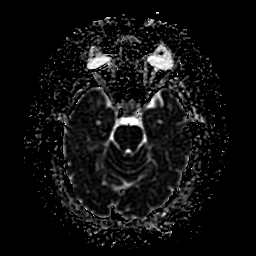
[im 33/50]
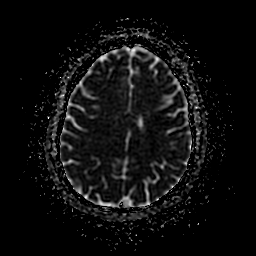
[im 50/50]
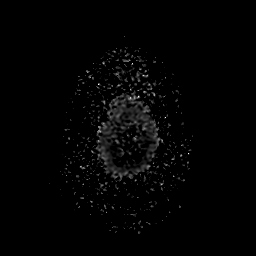

[Series 450: ADC · coronal · 4.0mm · 0.94mm/px · 3 of 35 slices shown (2 of 2)]
[im 1/35]
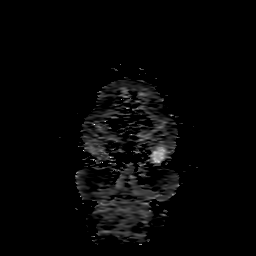
[im 18/35]
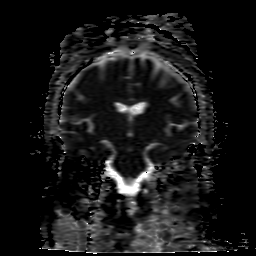
[im 35/35]
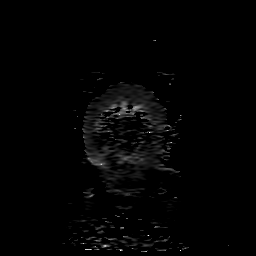

[33 of 48 positions shown; findings below may reference images not displayed]

FINDINGS: Brain: A single periventricular T2 hyperintensity adjacent to the
left lateral ventricle measures 6 mm maximally. No other significant
white matter disease is present. No acute infarct, hemorrhage, or
mass lesion is present. The ventricles are of normal size. No
significant extraaxial fluid collection is present.

The internal auditory canals are within normal limits. The brainstem
and cerebellum are within normal limits.

Vascular: Flow is present in the major intracranial arteries.

Skull and upper cervical spine: The craniocervical junction is
normal. Upper cervical spine is within normal limits. Marrow signal
is unremarkable.

Sinuses/Orbits: The paranasal sinuses and mastoid air cells are
clear. The globes and orbits are within normal limits.
IMPRESSION: 1. No acute infarct. No acute intracranial abnormality to explain
the patient's dizziness or syncope.
2. Single left periventricular T2 hyperintensity. While this may be
within normal limits, there is a differential diagnosis. The finding
is nonspecific but can be seen in the setting of chronic
microvascular ischemia, a demyelinating process such as multiple
sclerosis, vasculitis, complicated migraine headaches, or as the
sequelae of a prior infectious or inflammatory process.

## 2021-01-20 NOTE — Progress Notes (Signed)
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 Medical problem list includes GERD, Headache, Syncope, Hyperlipidemia, Obesity Medication list includes  Epworth score- 17 Body weight today-234 lbs Covid vax-none -----Patient states that she goes to sleep tired and wakes up tired. States she can sleep for 12 hours and wakes up still feeling tired. Husband states that she snores and he has seen her stop breathing in her sleep. States her and her husband work swing shifts so they have weird sleeping hours.  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 No sleep meds. Occasional coffee.  No ENT surgery, heart, lung or thyroid problems. Sleeps on adjustable bed. Discussed elevation to minimize reflux. Prior to Admission medications   Medication Sig Start Date End Date Taking? Authorizing Provider  cetirizine (ZYRTEC) 10 MG tablet Take 10 mg by mouth daily as needed for allergies.   Yes [provider]  famotidine (PEPCID) 20 MG tablet Take 1 tablet (20 mg total) by mouth daily. 11/11/18  Yes Joseph Art, DO  Prenatal Vit-Fe Fumarate-FA (MULTIVITAMIN-PRENATAL) 27-0.8 MG TABS tablet Take 1 tablet by mouth daily at 12 noon.   Yes [provider]  Vitamin D, Ergocalciferol, (DRISDOL) 1.25 MG (50000 UT) CAPS capsule TK 1 C PO 3 TIMES A WK 06/06/19  Yes [provider]   Past Medical History:  Diagnosis Date  . Chest pain   . Cluster headaches   . GERD (gastroesophageal reflux disease) 11/11/2018  . Hyperlipidemia   . Leukocytosis 11/11/2018  . Metabolic acidosis 11/11/2018  . Syncope 11/10/2018  . Syncope and collapse    Past Surgical History:  Procedure Laterality Date  . ABDOMINAL SURGERY    . BACK SURGERY     Family History  Problem Relation Age of Onset  . Hypertension Mother   . Hyperlipidemia Mother   . CAD Father   . Hyperlipidemia  Father   . Hypertension Brother   . Hyperlipidemia Brother    Social History   Socioeconomic History  . Marital status: Married    Spouse name: Not on file  . Number of children: Not on file  . Years of education: Not on file  . Highest education level: Not on file  Occupational History  . Not on file  Tobacco Use  . Smoking status: Never Smoker  . Smokeless tobacco: Never Used  Vaping Use  . Vaping Use: Never used  Substance and Sexual Activity  . Alcohol use: No  . Drug use: No  . Sexual activity: Not on file  Other Topics Concern  . Not on file  Social History Narrative  . Not on file   Social Determinants of Health   Financial Resource Strain: Not on file  Food Insecurity: Not on file  Transportation Needs: Not on file  Physical Activity: Not on file  Stress: Not on file  Social Connections: Not on file  Intimate Partner Violence: Not on file    ROS-see HPI  + = positive Constitutional:    weight loss, night sweats, fevers, chills, fatigue, lassitude. HEENT:    headaches, difficulty swallowing, tooth/dental problems, sore throat,       sneezing, itching, ear ache, nasal congestion, post nasal drip, snoring CV:    chest pain, orthopnea, PND, swelling in lower extremities, anasarca,  dizziness, palpitations Resp:   shortness of breath with exertion or at rest.                productive cough,   non-productive cough, coughing up of blood.              change in color of mucus.  wheezing.   Skin:    rash or lesions. GI:  + heartburn, I+ndigestion, abdominal pain, nausea, vomiting, diarrhea,                 change in bowel habits, loss of appetite GU: dysuria, change in color of urine, no urgency or frequency.   flank pain. MS:   joint pain, stiffness, decreased range of motion, back pain. Neuro-     nothing unusual Psych:  change in mood or affect.  depression or +anxiety.   memory loss.  OBJ- Physical Exam General- Alert,  Oriented, Affect-appropriate, Distress- none acute, + obese Skin- rash-none, lesions- none, excoriation- none Lymphadenopathy- none Head- atraumatic            Eyes- Gross vision intact, PERRLA, conjunctivae and secretions clear            Ears- Hearing, canals-normal            Nose- Clear, no-Septal dev, mucus, polyps, erosion, perforation             Throat- Mallampati III-IV , mucosa clear , drainage- none, tonsils- atrophic, + teeth Neck- flexible , trachea midline, no stridor , thyroid nl, carotid no bruit Chest - symmetrical excursion , unlabored           Heart/CV- RRR , no murmur , no gallop  , no rub, nl s1 s2                           - JVD- none , edema- none, stasis changes- none, varices- none           Lung- clear to P&A, wheeze- none, cough- none , dullness-none, rub- none           Chest wall-  Abd-  Br/ Gen/ Rectal- Not done, not indicated Extrem- cyanosis- none, clubbing, none, atrophy- none, strength- nl Neuro- grossly intact to observation

## 2021-01-21 ENCOUNTER — Other Ambulatory Visit: Payer: Self-pay

## 2021-01-21 ENCOUNTER — Encounter: Payer: Self-pay | Admitting: Internal Medicine

## 2021-01-21 ENCOUNTER — Ambulatory Visit: Payer: BC Managed Care – PPO | Admitting: Internal Medicine

## 2021-01-21 VITALS — BP 124/90 | HR 79 | Temp 97.6°F | Ht 62.0 in | Wt 234.2 lb

## 2021-01-21 DIAGNOSIS — G4726 Circadian rhythm sleep disorder, shift work type: Secondary | ICD-10-CM | POA: Diagnosis not present

## 2021-01-21 DIAGNOSIS — R0683 Snoring: Secondary | ICD-10-CM | POA: Diagnosis not present

## 2021-01-21 DIAGNOSIS — G4733 Obstructive sleep apnea (adult) (pediatric): Secondary | ICD-10-CM | POA: Insufficient documentation

## 2021-01-21 NOTE — Patient Instructions (Signed)
Order- schedule home sleep test   Dx snoring  Please call us for results and recommendations about 2 weeks after your sleep test. 

## 2021-01-21 NOTE — Assessment & Plan Note (Signed)
Witnessed loud snoring and apnea. Probable OSA. Appropriate discussion. Plan- sleep study, then likely CPAP, using same DME as husband.

## 2021-01-21 NOTE — Assessment & Plan Note (Signed)
Encourage diet and exercise with goal of weight loss.

## 2021-01-21 NOTE — Assessment & Plan Note (Signed)
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

## 2021-02-23 ENCOUNTER — Other Ambulatory Visit: Payer: Self-pay | Admitting: Podiatry

## 2021-02-23 ENCOUNTER — Ambulatory Visit (INDEPENDENT_AMBULATORY_CARE_PROVIDER_SITE_OTHER): Payer: BC Managed Care – PPO | Admitting: Podiatry

## 2021-02-23 ENCOUNTER — Ambulatory Visit (INDEPENDENT_AMBULATORY_CARE_PROVIDER_SITE_OTHER): Payer: BC Managed Care – PPO

## 2021-02-23 ENCOUNTER — Other Ambulatory Visit: Payer: Self-pay

## 2021-02-23 ENCOUNTER — Encounter: Payer: Self-pay | Admitting: Podiatry

## 2021-02-23 DIAGNOSIS — F419 Anxiety disorder, unspecified: Secondary | ICD-10-CM | POA: Insufficient documentation

## 2021-02-23 DIAGNOSIS — M79672 Pain in left foot: Secondary | ICD-10-CM

## 2021-02-23 DIAGNOSIS — Z780 Asymptomatic menopausal state: Secondary | ICD-10-CM | POA: Insufficient documentation

## 2021-02-23 DIAGNOSIS — M722 Plantar fascial fibromatosis: Secondary | ICD-10-CM | POA: Diagnosis not present

## 2021-02-23 DIAGNOSIS — M21619 Bunion of unspecified foot: Secondary | ICD-10-CM

## 2021-02-23 DIAGNOSIS — R0600 Dyspnea, unspecified: Secondary | ICD-10-CM | POA: Insufficient documentation

## 2021-02-23 DIAGNOSIS — Z1379 Encounter for other screening for genetic and chromosomal anomalies: Secondary | ICD-10-CM | POA: Insufficient documentation

## 2021-02-23 DIAGNOSIS — R000 Tachycardia, unspecified: Secondary | ICD-10-CM | POA: Insufficient documentation

## 2021-02-23 DIAGNOSIS — Z0000 Encounter for general adult medical examination without abnormal findings: Secondary | ICD-10-CM | POA: Insufficient documentation

## 2021-02-23 DIAGNOSIS — F331 Major depressive disorder, recurrent, moderate: Secondary | ICD-10-CM | POA: Insufficient documentation

## 2021-02-23 DIAGNOSIS — R42 Dizziness and giddiness: Secondary | ICD-10-CM | POA: Insufficient documentation

## 2021-02-23 DIAGNOSIS — Z6841 Body Mass Index (BMI) 40.0 and over, adult: Secondary | ICD-10-CM | POA: Insufficient documentation

## 2021-02-23 MED ORDER — TRIAMCINOLONE ACETONIDE 10 MG/ML IJ SUSP
20.0000 mg | Freq: Once | INTRAMUSCULAR | Status: AC
  Administered 2021-02-23: 20 mg

## 2021-02-23 MED ORDER — DICLOFENAC SODIUM 75 MG PO TBEC: 75.0000 mg | DELAYED_RELEASE_TABLET | Freq: Two times a day (BID) | ORAL | 4 refills | Status: DC

## 2021-02-23 NOTE — Progress Notes (Signed)
Subjective:   Patient ID: Audrey Greene [place], female   DOB: 35 y.o.   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  She wants to consider this we will do it in future at her convenience  X-rays indicate significant adductus foot structure with bunion deformity and plantar heel spur deformity bilateral moderate flattening of the arch

## 2021-03-07 ENCOUNTER — Other Ambulatory Visit: Payer: Self-pay

## 2021-03-07 ENCOUNTER — Ambulatory Visit: Payer: BC Managed Care – PPO

## 2021-03-07 DIAGNOSIS — G4733 Obstructive sleep apnea (adult) (pediatric): Secondary | ICD-10-CM | POA: Diagnosis not present

## 2021-03-07 DIAGNOSIS — R0683 Snoring: Secondary | ICD-10-CM

## 2021-03-11 DIAGNOSIS — G4733 Obstructive sleep apnea (adult) (pediatric): Secondary | ICD-10-CM | POA: Diagnosis not present

## 2021-05-26 NOTE — Progress Notes (Signed)
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 Medical problem list includes GERD, Headache, Syncope, Hyperlipidemia, Obesity Medication list includes  Epworth score- 17 Body weight today-234 lbs Covid vax-none -----Patient states that she goes to sleep tired and wakes up tired. States she can sleep for 12 hours and wakes up still feeling tired. Husband states that she snores and he has seen her stop breathing in her sleep. States her and her husband work swing shifts so they have weird sleeping hours.  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 No sleep meds. Occasional coffee.  No ENT surgery, heart, lung or thyroid problems. Sleeps on adjustable bed. Discussed elevation to minimize reflux.  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 Discussed treatment. CPAP is clearly first choice. Emphasized comfort issues.  We will start CPAP5-15.  ROS-see HPI  + = positive Constitutional:    weight loss, night sweats, fevers, chills, fatigue, lassitude. HEENT:    headaches, difficulty swallowing, tooth/dental problems, sore throat,       sneezing, itching, ear ache, nasal congestion, post nasal drip, snoring CV:    chest pain, orthopnea, PND, swelling in lower extremities, anasarca,                                   dizziness, palpitations Resp:   shortness of breath with exertion or at rest.                productive cough,   non-productive cough, coughing up of blood.              change in color of mucus.  wheezing.   Skin:    rash or lesions. GI:  + heartburn, I+ndigestion,  abdominal pain, nausea, vomiting, diarrhea,                 change in bowel habits, loss of appetite GU: dysuria, change in color of urine, no urgency or frequency.   flank pain. MS:   joint pain, stiffness, decreased range of motion, back pain. Neuro-     nothing unusual Psych:  change in mood or affect.  depression or +anxiety.   memory loss.  OBJ- Physical Exam General- Alert, Oriented, Affect-appropriate, Distress- none acute, + obese Skin- rash-none, lesions- none, excoriation- none Lymphadenopathy- none Head- atraumatic            Eyes- Gross vision intact, PERRLA, conjunctivae and secretions clear            Ears- Hearing, canals-normal            Nose- Clear, no-Septal dev, mucus, polyps, erosion, perforation             Throat- Mallampati III-IV , mucosa clear , drainage- none, tonsils- atrophic, + teeth Neck- flexible , trachea midline, no stridor , thyroid nl, carotid no bruit Chest - symmetrical excursion , unlabored           Heart/CV- RRR , no murmur , no gallop  , no rub, nl s1 s2                           -  JVD- none , edema- none, stasis changes- none, varices- none           Lung- clear to P&A, wheeze- none, cough- none , dullness-none, rub- none           Chest wall-  Abd-  Br/ Gen/ Rectal- Not done, not indicated Extrem- cyanosis- none, clubbing, none, atrophy- none, strength- nl Neuro- grossly intact to observation

## 2021-05-27 ENCOUNTER — Encounter: Payer: Self-pay | Admitting: Internal Medicine

## 2021-05-27 ENCOUNTER — Other Ambulatory Visit: Payer: Self-pay

## 2021-05-27 ENCOUNTER — Ambulatory Visit: Payer: BC Managed Care – PPO | Admitting: Internal Medicine

## 2021-05-27 VITALS — BP 122/88 | HR 105 | Temp 98.3°F | Ht 63.0 in | Wt 226.0 lb

## 2021-05-27 DIAGNOSIS — G4733 Obstructive sleep apnea (adult) (pediatric): Secondary | ICD-10-CM | POA: Diagnosis not present

## 2021-05-27 NOTE — Patient Instructions (Signed)
Order- new DME [facility]/ [facility]   new CPAP auto 5-15, mask of choice, humidifier, supplies, AirView/ card  Please call if we can help

## 2021-05-27 NOTE — Assessment & Plan Note (Signed)
Encourage diet and exercise. 

## 2021-05-27 NOTE — Assessment & Plan Note (Signed)
Appropriate discussion Plan- start CPAP auto 5-15, mask of choice, humidifier, supplies, AirView/ card  Using [facility] same as husband

## 2021-09-12 ENCOUNTER — Other Ambulatory Visit: Payer: Self-pay | Admitting: Podiatry

## 2021-09-13 NOTE — Telephone Encounter (Signed)
Please advise 

## 2021-09-14 NOTE — Telephone Encounter (Signed)
I'll refill but she should be seen for evaluation

## 2021-09-23 NOTE — Telephone Encounter (Signed)
Needs to come in

## 2021-09-23 NOTE — Progress Notes (Deleted)
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 Discussed treatment. CPAP is clearly first choice. Emphasized comfort issues.  We will start CPAP5-15.  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 HEENT:    headaches, difficulty swallowing, tooth/dental problems, sore throat,       sneezing, itching, ear ache, nasal congestion, post nasal drip, snoring CV:    chest pain, orthopnea, PND, swelling in lower extremities, anasarca,                                   dizziness, palpitations Resp:   shortness of breath with exertion or at rest.                productive cough,   non-productive cough, coughing up of blood.              change in color of mucus.  wheezing.   Skin:    rash or lesions. GI:  + heartburn, I+ndigestion, abdominal pain, nausea, vomiting, diarrhea,                 change in bowel habits, loss of appetite GU: dysuria, change in color of urine, no urgency or frequency.   flank pain. MS:   joint pain, stiffness, decreased range of motion, back pain. Neuro-     nothing unusual Psych:  change in mood or affect.   depression or +anxiety.   memory loss.  OBJ- Physical Exam General- Alert, Oriented, Affect-appropriate, Distress- none acute, + obese Skin- rash-none, lesions- none, excoriation- none Lymphadenopathy- none Head- atraumatic            Eyes- Gross vision intact, PERRLA, conjunctivae and secretions clear            Ears- Hearing, canals-normal            Nose- Clear, no-Septal dev, mucus, polyps, erosion, perforation             Throat- Mallampati III-IV , mucosa clear , drainage- none, tonsils- atrophic, + teeth Neck- flexible , trachea midline, no stridor , thyroid nl, carotid no bruit Chest - symmetrical excursion , unlabored           Heart/CV- RRR , no murmur , no gallop  , no rub, nl s1 s2                           - JVD- none , edema- none, stasis changes- none, varices- none           Lung- clear to P&A, wheeze- none, cough- none , dullness-none, rub- none           Chest wall-  Abd-  Br/ Gen/ Rectal- Not done, not indicated Extrem- cyanosis- none, clubbing, none, atrophy- none, strength- nl Neuro- grossly intact to observation

## 2021-09-26 ENCOUNTER — Ambulatory Visit: Payer: BC Managed Care – PPO | Admitting: Internal Medicine

## 2021-12-28 NOTE — Progress Notes (Deleted)
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 ?===================================================== ? 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 He is on CPAP. ?Medical problem list includes GERD, Headache, Syncope, Hyperlipidemia, Obesity ?Medication list includes  ?Epworth score- 17 ?Body weight today-234 lbs ?Covid vax-none ?-----Patient states that she goes to sleep tired and wakes up tired. States she can sleep for 12 hours and wakes up still feeling tired. Husband states that she snores and he has seen her stop breathing in her sleep. States her and her husband work swing shifts so they have weird sleeping hours.  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 No sleep meds. Occasional coffee.  ?No ENT surgery, heart, lung or thyroid problems. ?Sleeps on adjustable bed. Discussed elevation to minimize reflux. ? 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 Discussed treatment. CPAP is clearly first choice. Emphasized comfort issues.  ?We will start CPAP5-15. ? ?12/29/21- 36 yoF never smoker followed for OSA, Shift Worker, complicated by GERD, Headache, Syncope, Hyperlipidemia, Obesity ?CPAP 5-15/ [facility]     ordered 05/27/21 ?Download compliance- ?Body weight today- ?Covid vax- ?Flu vax- ? ? ?Body weight today-226 lbs ?Covid vax-none ?Declines flu vax- ? ? ? ?ROS-see HPI  + = positive ?Constitutional:    weight loss, night sweats, fevers, chills, fatigue,  lassitude. ?HEENT:    headaches, difficulty swallowing, tooth/dental problems, sore throat,  ?     sneezing, itching, ear ache, nasal congestion, post nasal drip, snoring ?CV:    chest pain, orthopnea, PND, swelling in lower extremities, anasarca,                                  ? dizziness, palpitations ?Resp:   shortness of breath with exertion or at rest.   ?             productive cough,   non-productive cough, coughing up of blood.   ?           change in color of mucus.  wheezing.   ?Skin:    rash or lesions. ?GI:  + heartburn, I+ndigestion, abdominal pain, nausea, vomiting, diarrhea,  ?               change in bowel habits, loss of appetite ?GU: dysuria, change in color of urine, no urgency or frequency.   flank pain. ?MS:   joint pain, stiffness, decreased range of motion, back pain. ?Neuro-     nothing unusual ?Psych:  change in mood or affect.  depression or +anxiety.   memory loss. ? ?OBJ- Physical Exam ?General- Alert, Oriented, Affect-appropriate, Distress- none acute, + obese ?Skin- rash-none, lesions- none, excoriation- none ?Lymphadenopathy- none ?Head- atraumatic ?           Eyes- Gross vision intact, PERRLA, conjunctivae and secretions clear ?           Ears- Hearing, canals-normal ?  Nose- Clear, no-Septal dev, mucus, polyps, erosion, perforation  ?           Throat- Mallampati III-IV , mucosa clear , drainage- none, tonsils- atrophic, + teeth ?Neck- flexible , trachea midline, no stridor , thyroid nl, carotid no bruit ?Chest - symmetrical excursion , unlabored ?          Heart/CV- RRR , no murmur , no gallop  , no rub, nl s1 s2 ?                          - JVD- none , edema- none, stasis changes- none, varices- none ?          Lung- clear to P&A, wheeze- none, cough- none , dullness-none, rub- none ?          Chest wall-  ?Abd-  ?Br/ Gen/ Rectal- Not done, not indicated ?Extrem- cyanosis- none, clubbing, none, atrophy- none, strength- nl ?Neuro- grossly intact to observation ? ? ?

## 2021-12-29 ENCOUNTER — Ambulatory Visit: Payer: BC Managed Care – PPO | Admitting: Internal Medicine

## 2022-03-09 ENCOUNTER — Encounter: Payer: Self-pay | Admitting: Internal Medicine

## 2022-03-11 NOTE — Progress Notes (Signed)
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 Discussed treatment. CPAP is clearly first choice. Emphasized comfort issues.  We will start CPAP5-15.  03/13/22- 36 yoF never smoker followed for OSA, Shift Worker, complicated by GERD, Headache, Syncope, Hyperlipidemia, Obesity CPAP 5-15/ [facility]    AirSense 11 AutoSet  ordered 05/27/21 Download compliance 97%, AHI 1/ hr Body weight today-243 lbs Covid vax- With husband today.  Download reviewed.  Continue to shift work.  Comfortable with her CPAP with no concerns expressed.  No major health issues.  ROS-see HPI  + = positive Constitutional:    weight loss, night sweats, fevers, chills, fatigue, lassitude. HEENT:    headaches, difficulty swallowing, tooth/dental problems, sore throat,       sneezing, itching, ear ache, nasal congestion, post nasal drip, snoring CV:    chest pain, orthopnea, PND, swelling in lower extremities, anasarca,                                   dizziness, palpitations Resp:   shortness of breath with exertion or at rest.                productive cough,   non-productive cough, coughing up of blood.              change in color of mucus.  wheezing.   Skin:    rash or lesions. GI:  + heartburn, I+ndigestion, abdominal pain, nausea, vomiting, diarrhea,                 change in bowel habits, loss of appetite GU: dysuria, change in color of urine, no urgency or  frequency.   flank pain. MS:   joint pain, stiffness, decreased range of motion, back pain. Neuro-     nothing unusual Psych:  change in mood or affect.  depression or +anxiety.   memory loss.  OBJ- Physical Exam General- Alert, Oriented, Affect-appropriate, Distress- none acute, + obese Skin- rash-none, lesions- none, excoriation- none Lymphadenopathy- none Head- atraumatic            Eyes- Gross vision intact, PERRLA, conjunctivae and secretions clear            Ears- Hearing, canals-normal            Nose- Clear, no-Septal dev, mucus, polyps, erosion, perforation             Throat- Mallampati III-IV , mucosa clear , drainage- none, tonsils- atrophic, + teeth Neck- flexible , trachea midline, no stridor , thyroid nl, carotid no bruit Chest - symmetrical excursion , unlabored           Heart/CV- RRR , no murmur , no gallop  , no rub, nl s1 s2                           - JVD- none , edema- none, stasis changes- none, varices- none  Lung- clear to P&A, wheeze- none, cough- none , dullness-none, rub- none           Chest wall-  Abd-  Br/ Gen/ Rectal- Not done, not indicated Extrem- cyanosis- none, clubbing, none, atrophy- none, strength- nl Neuro- grossly intact to observation

## 2022-03-13 ENCOUNTER — Encounter: Payer: Self-pay | Admitting: Internal Medicine

## 2022-03-13 ENCOUNTER — Ambulatory Visit: Payer: BC Managed Care – PPO | Admitting: Internal Medicine

## 2022-03-13 DIAGNOSIS — G4726 Circadian rhythm sleep disorder, shift work type: Secondary | ICD-10-CM | POA: Diagnosis not present

## 2022-03-13 DIAGNOSIS — G4733 Obstructive sleep apnea (adult) (pediatric): Secondary | ICD-10-CM

## 2022-03-23 ENCOUNTER — Other Ambulatory Visit: Payer: Self-pay | Admitting: Podiatry

## 2022-03-24 NOTE — Telephone Encounter (Signed)
Have not seen her in a year. If she is having pain she should be seen

## 2022-04-20 NOTE — Assessment & Plan Note (Signed)
Benefits from CPAP with good compliance and control Plan- continue auto 5-15 

## 2022-04-20 NOTE — Assessment & Plan Note (Signed)
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

## 2022-04-20 NOTE — Assessment & Plan Note (Signed)
Encouraged lifestyle adjustments for her and husband toward normalizing body weight.

## 2022-05-11 ENCOUNTER — Encounter: Payer: Self-pay | Admitting: Podiatry

## 2022-05-11 ENCOUNTER — Ambulatory Visit (INDEPENDENT_AMBULATORY_CARE_PROVIDER_SITE_OTHER): Payer: BC Managed Care – PPO

## 2022-05-11 ENCOUNTER — Ambulatory Visit: Payer: BC Managed Care – PPO | Admitting: Podiatry

## 2022-05-11 DIAGNOSIS — M722 Plantar fascial fibromatosis: Secondary | ICD-10-CM

## 2022-05-11 DIAGNOSIS — M21619 Bunion of unspecified foot: Secondary | ICD-10-CM

## 2022-05-11 MED ORDER — TRIAMCINOLONE ACETONIDE 10 MG/ML IJ SUSP
20.0000 mg | Freq: Once | INTRAMUSCULAR | Status: AC
  Administered 2022-05-11: 20 mg

## 2022-05-11 MED ORDER — DICLOFENAC SODIUM 75 MG PO TBEC: 75.0000 mg | DELAYED_RELEASE_TABLET | Freq: Two times a day (BID) | ORAL | 2 refills | Status: DC

## 2022-05-11 NOTE — Progress Notes (Signed)
Subjective:   Patient ID: [place], female   DOB: 36 y.o.   MRN: [id number]   HPI Patient presents stating my heels have started to really hurt again plus my arches and I know that I would have to get my bunions fixed at 1 point in the future   ROS      Objective:  Physical Exam  Neuro vascular status intact with patient found to have exquisite discomfort plantar aspect heel region bilateral with inflammation fluid buildup and also into the arches and red painful structural bunion deformity first MPJ bilateral     Assessment:  Chronic Planter fasciitis bilateral along with fascial inflammation of the arch and bunion deformity with flatfoot deformity noted bilateral      Plan:  H&P x-rays reviewed conditions discussed and at this point I did go ahead and I did sterile prep and injected the plantar fascia bilateral 3 mg dexamethasone Kenalog 5 mg Xylocaine applied night splints bilateral as it is hard for her to just use 1 that I want her to use at least 30 minutes twice a day and sleep and as needed and advised on aggressive ice therapy discussed bunion correction which I think will be done in the future at her convenience  X-rays indicate there is significant elevation of the intermetatarsal angle left over right flatfoot deformity large plantar spurs no indication stress fracture arthritis

## 2022-07-31 ENCOUNTER — Ambulatory Visit (INDEPENDENT_AMBULATORY_CARE_PROVIDER_SITE_OTHER): Payer: BC Managed Care – PPO | Admitting: Podiatry

## 2022-07-31 ENCOUNTER — Encounter: Payer: Self-pay | Admitting: Podiatry

## 2022-07-31 DIAGNOSIS — M722 Plantar fascial fibromatosis: Secondary | ICD-10-CM | POA: Diagnosis not present

## 2022-07-31 DIAGNOSIS — E0810 Diabetes mellitus due to underlying condition with ketoacidosis without coma: Secondary | ICD-10-CM

## 2022-07-31 DIAGNOSIS — M79671 Pain in right foot: Secondary | ICD-10-CM

## 2022-07-31 DIAGNOSIS — M79672 Pain in left foot: Secondary | ICD-10-CM

## 2022-07-31 DIAGNOSIS — M21619 Bunion of unspecified foot: Secondary | ICD-10-CM

## 2022-07-31 NOTE — Progress Notes (Signed)
Subjective:   Patient ID: [place], female   DOB: 36 y.o.   MRN: [id number]   HPI Patient presents stating the heels have started to hurt again but she been diagnosed with diabetes and did have elevated blood sugars of approximate 600.  Patient is concerned about overall health and also has structural bunions   ROS      Objective:  Physical Exam  Neurovascular status unchanged with no indications of neuropathic disease currently with sugars which are improving but still elevated with discomfort in the heels moderate flatfoot deformity structural bunion deformity bilateral     Assessment:  Patient may be in an inflammatory complex due to the extremely high sugars that she had with structural bunion deformity and fascial inflammation noted     Plan:  H&P discussed her diabetes at great length and what she is doing now and I do think that it is getting come under control and she is also taking medicine to help reduce her weight which I think will be valuable.  Discussed the foot fasciitis discussed the bunions at 1 point some may need to be done we may need new orthotics but I want first her to get her diabetes under control and I educated her on daily foot inspections and patient will be seen back in 3 months or earlier if needed

## 2022-08-03 ENCOUNTER — Other Ambulatory Visit: Payer: Self-pay | Admitting: Podiatry

## 2022-10-20 ENCOUNTER — Other Ambulatory Visit: Payer: Self-pay | Admitting: Podiatry

## 2022-11-03 ENCOUNTER — Ambulatory Visit: Payer: BC Managed Care – PPO | Admitting: Podiatry

## 2022-11-03 ENCOUNTER — Encounter: Payer: Self-pay | Admitting: Podiatry

## 2022-11-03 DIAGNOSIS — M722 Plantar fascial fibromatosis: Secondary | ICD-10-CM

## 2022-11-03 DIAGNOSIS — Q828 Other specified congenital malformations of skin: Secondary | ICD-10-CM

## 2022-11-03 MED ORDER — TRIAMCINOLONE ACETONIDE 10 MG/ML IJ SUSP
20.0000 mg | Freq: Once | INTRAMUSCULAR | Status: AC
  Administered 2022-11-03: 20 mg

## 2022-11-03 NOTE — Progress Notes (Signed)
Subjective:   Patient ID: Audrey Greene, female   DOB: 38 y.o.   MRN: [id number]   HPI Patient presents stating that her heels have really started to bother her again and she has a small lesion on the outside of the left foot at this time.  She is working hard at her diabetes trying to get it under better control   ROS      Objective:  Physical Exam  Neurovascular status intact with patient found to have acute inflammation plantar heel region bilateral fluid buildup around the area with digital perfusion found to be good bilateral with small lesion left probable lucent core painful     Assessment:  Acute Planter fasciitis bilateral porokeratotic lesion left     Plan:  H&P reviewed condition sterile prep injected the plantar fascia bilateral 3 mg Kenalog 5 mg Xylocaine debrided lesion left reappoint 4 months to reevaluate may require other treatments

## 2023-01-09 ENCOUNTER — Other Ambulatory Visit: Payer: Self-pay | Admitting: Podiatry

## 2023-02-27 ENCOUNTER — Telehealth: Payer: Self-pay | Admitting: Podiatry

## 2023-02-27 ENCOUNTER — Encounter: Payer: Self-pay | Admitting: Podiatry

## 2023-02-27 NOTE — Telephone Encounter (Signed)
Lft 2nd message for pt that appt needs to be r/s, appt has been canceled due to provider out of office..mailing letter

## 2023-03-05 ENCOUNTER — Ambulatory Visit: Payer: BC Managed Care – PPO | Admitting: Podiatry

## 2023-03-08 ENCOUNTER — Encounter: Payer: Self-pay | Admitting: Podiatry

## 2023-03-08 ENCOUNTER — Ambulatory Visit: Payer: BC Managed Care – PPO | Admitting: Podiatry

## 2023-03-08 DIAGNOSIS — M722 Plantar fascial fibromatosis: Secondary | ICD-10-CM | POA: Diagnosis not present

## 2023-03-08 DIAGNOSIS — M21619 Bunion of unspecified foot: Secondary | ICD-10-CM | POA: Diagnosis not present

## 2023-03-08 DIAGNOSIS — E0810 Diabetes mellitus due to underlying condition with ketoacidosis without coma: Secondary | ICD-10-CM

## 2023-03-10 NOTE — Progress Notes (Signed)
Subjective:   Patient ID: Audrey Greene [place], female   DOB: 37 y.o.   MRN: [id number]   HPI Patient had history of long-term Planter fasciitis and nail disease and diabetes that she is trying to get under control.  States that she is here for check of everything and to discuss future   ROS      Objective:  Physical Exam  Neuro vascular status intact significant diminishment of arch height bilateral with structural bunion deformity and history of fascial inflammation with no loss of proprioception currently     Assessment:  Significant structural malalignment bunion deformity Planter fasciitis but no indications currently of diabetic related damage     Plan:  H&P reviewed I do think new orthotics in the future will be necessary but organ to hold off as her other pair was lost when she lost her last job.  At this point she will continue to work at reducing her A1c and improving sugar and that was discussed with her and bunions were discussed which will ultimately need to be corrected

## 2023-03-14 NOTE — Progress Notes (Signed)
HPI F never smoker followed for OSA, Shift Worker, complicated by GERD, Headache, Syncope, Hyperlipidemia, Obesity HST 03/07/21- AHI 34.2/ hr, desaturation to 72%, body weight 234 lbs  =============================================================================   03/13/22- 36 yoF never smoker followed for OSA, Shift Worker, complicated by GERD, Headache, Syncope, Hyperlipidemia, Obesity CPAP 5-15/ [facility]    AirSense 11 AutoSet  ordered 05/27/21 Download compliance 97%, AHI 1/ hr Body weight today-243 lbs Covid vax- With husband today.  Download reviewed.  Continue to shift work.  Comfortable with her CPAP with no concerns expressed.  No major health issues.  03/15/23- 37 yoF never smoker followed for OSA, Shift Worker, complicated by GERD, Headache, Syncope, Hyperlipidemia, Obesity, DM2, CPAP 5-15/ [facility]    AirSense 11 AutoSet  ordered 05/27/21 Download compliance 97%, AHI 1.4/ hr Body weight today-224 lbs Download reviewed. Complains of tiredness. Try caffeine tab, short naps. Works nights- on 4 12 hour shifts, then off 4 shifts. 8-9 hours time in bed, with nocturia x 2. No sleep med. Claims little efffect from occasional coffee. Newly dx'd DM.  ROS-see HPI  + = positive Constitutional:    weight loss, night sweats, fevers, chills, fatigue, lassitude. HEENT:    headaches, difficulty swallowing, tooth/dental problems, sore throat,       sneezing, itching, ear ache, nasal congestion, post nasal drip, snoring CV:    chest pain, orthopnea, PND, swelling in lower extremities, anasarca,                                   dizziness, palpitations Resp:   shortness of breath with exertion or at rest.                productive cough,   non-productive cough, coughing up of blood.              change in color of mucus.  wheezing.   Skin:    rash or lesions. GI:  + heartburn, I+ndigestion, abdominal pain, nausea, vomiting, diarrhea,                 change in bowel habits, loss of  appetite GU: dysuria, change in color of urine, no urgency or frequency.   flank pain. MS:   joint pain, stiffness, decreased range of motion, back pain. Neuro-     nothing unusual Psych:  change in mood or affect.  depression or +anxiety.   memory loss.  OBJ- Physical Exam General- Alert, Oriented, Affect-appropriate, Distress- none acute, + obese Skin- rash-none, lesions- none, excoriation- none Lymphadenopathy- none Head- atraumatic            Eyes- Gross vision intact, PERRLA, conjunctivae and secretions clear            Ears- Hearing, canals-normal            Nose- Clear, no-Septal dev, mucus, polyps, erosion, perforation             Throat- Mallampati III-IV , mucosa clear , drainage- none, tonsils- atrophic, + teeth Neck- flexible , trachea midline, no stridor , thyroid nl, carotid no bruit Chest - symmetrical excursion , unlabored           Heart/CV- RRR , no murmur , no gallop  , no rub, nl s1 s2                           - JVD- none , edema-  none, stasis changes- none, varices- none           Lung- clear to P&A, wheeze- none, cough- none , dullness-none, rub- none           Chest wall-  Abd-  Br/ Gen/ Rectal- Not done, not indicated Extrem- cyanosis- none, clubbing, none, atrophy- none, strength- nl Neuro- grossly intact to observation

## 2023-03-15 ENCOUNTER — Ambulatory Visit: Payer: BC Managed Care – PPO | Admitting: Internal Medicine

## 2023-03-15 ENCOUNTER — Encounter: Payer: Self-pay | Admitting: Internal Medicine

## 2023-03-15 VITALS — BP 102/82 | HR 76 | Ht 62.0 in | Wt 224.4 lb

## 2023-03-15 DIAGNOSIS — G4733 Obstructive sleep apnea (adult) (pediatric): Secondary | ICD-10-CM | POA: Diagnosis not present

## 2023-03-15 NOTE — Patient Instructions (Signed)
Your CPAP is working well- we can continue auto 5-15  We suggested trying a caffeine tab, short naps and similar adjustment to see if anything makes a difference. We can try a prescription stimulant if we need to.

## 2023-04-15 ENCOUNTER — Other Ambulatory Visit: Payer: Self-pay | Admitting: Podiatry

## 2023-04-27 ENCOUNTER — Encounter: Payer: Self-pay | Admitting: Internal Medicine

## 2023-04-27 NOTE — Assessment & Plan Note (Signed)
Benefits from CPAP with good compliance and control Plan- continue auto 5-15 

## 2023-04-27 NOTE — Assessment & Plan Note (Signed)
Important to sustain lifestyle efforts- diet, exercise

## 2023-05-15 ENCOUNTER — Encounter: Payer: Self-pay | Admitting: Internal Medicine

## 2023-08-31 ENCOUNTER — Other Ambulatory Visit: Payer: Self-pay | Admitting: Podiatry

## 2023-09-27 ENCOUNTER — Ambulatory Visit: Payer: BC Managed Care – PPO | Admitting: Podiatry

## 2023-09-28 ENCOUNTER — Ambulatory Visit (INDEPENDENT_AMBULATORY_CARE_PROVIDER_SITE_OTHER): Payer: BC Managed Care – PPO

## 2023-09-28 ENCOUNTER — Encounter: Payer: Self-pay | Admitting: Podiatry

## 2023-09-28 ENCOUNTER — Ambulatory Visit (INDEPENDENT_AMBULATORY_CARE_PROVIDER_SITE_OTHER): Payer: BC Managed Care – PPO | Admitting: Podiatry

## 2023-09-28 DIAGNOSIS — M722 Plantar fascial fibromatosis: Secondary | ICD-10-CM | POA: Diagnosis not present

## 2023-09-28 DIAGNOSIS — M79672 Pain in left foot: Secondary | ICD-10-CM | POA: Diagnosis not present

## 2023-09-28 DIAGNOSIS — M79671 Pain in right foot: Secondary | ICD-10-CM

## 2023-09-28 MED ORDER — TRIAMCINOLONE ACETONIDE 10 MG/ML IJ SUSP
10.0000 mg | Freq: Once | INTRAMUSCULAR | Status: AC
  Administered 2023-09-28: 10 mg via INTRA_ARTICULAR

## 2023-09-28 MED ORDER — MELOXICAM 15 MG PO TABS: 15.0000 mg | ORAL_TABLET | Freq: Every day | ORAL | 2 refills | Status: DC

## 2023-09-30 NOTE — Progress Notes (Signed)
 Subjective:   Patient ID: Audrey Greene, female   DOB: 38 y.o.   MRN: [id number]   HPI Patient presents stating that her left second toe seems numb she has not been able to take his good care of her diabetes her heels and been hurting her bilateral and he is just having general foot pain after periods of work with 12-hour shifts   ROS      Objective:  Physical Exam  Neuro vascular status intact with the patient found to have quite a bit of inflammation plantar heel region bilateral and also was noted to have mild changes around the second digit left but no indications of loss of circulatory status for other vascular pathology.     Assessment:  Chronic Planter fasciitis bilateral that gradually worsens over time with pain in both arches and into the forefoot bilateral not as intense with stress second digit left noted but no indications of long-term pathology 8     Plan:  NP reviewed the importance of good diabetic control and recommended an endocrinologist to try to get her in a better condition.  Went ahead today and will get a focus on the fascia and I did sterile prep injected the plantar fascia bilateral at insertion 3 mg Kenalog  5 mg Xylocaine advised on night splint usage continued orthotic usage and reviewed x-rays  X-rays indicate no stress fracture was noted no arthritis that is gotten worse with plantar fascial inflammation spur formation and bunion formation bilateral

## 2023-10-17 ENCOUNTER — Inpatient Hospital Stay: Payer: BC Managed Care – PPO

## 2023-10-17 ENCOUNTER — Other Ambulatory Visit: Payer: Self-pay | Admitting: Hematology and Oncology

## 2023-10-17 ENCOUNTER — Encounter: Payer: Self-pay | Admitting: Hematology and Oncology

## 2023-10-17 ENCOUNTER — Inpatient Hospital Stay: Payer: BC Managed Care – PPO | Attending: Hematology and Oncology | Admitting: Hematology and Oncology

## 2023-10-17 VITALS — BP 127/96 | HR 91 | Temp 98.9°F | Resp 16 | Ht 63.0 in | Wt 206.1 lb

## 2023-10-17 DIAGNOSIS — Z8042 Family history of malignant neoplasm of prostate: Secondary | ICD-10-CM | POA: Insufficient documentation

## 2023-10-17 DIAGNOSIS — D751 Secondary polycythemia: Secondary | ICD-10-CM

## 2023-10-17 DIAGNOSIS — D72829 Elevated white blood cell count, unspecified: Secondary | ICD-10-CM

## 2023-10-17 DIAGNOSIS — Z832 Family history of diseases of the blood and blood-forming organs and certain disorders involving the immune mechanism: Secondary | ICD-10-CM | POA: Diagnosis not present

## 2023-10-17 DIAGNOSIS — E119 Type 2 diabetes mellitus without complications: Secondary | ICD-10-CM | POA: Insufficient documentation

## 2023-10-17 DIAGNOSIS — G4730 Sleep apnea, unspecified: Secondary | ICD-10-CM | POA: Diagnosis not present

## 2023-10-17 DIAGNOSIS — Z800 Family history of malignant neoplasm of digestive organs: Secondary | ICD-10-CM | POA: Insufficient documentation

## 2023-10-17 DIAGNOSIS — Z7984 Long term (current) use of oral hypoglycemic drugs: Secondary | ICD-10-CM | POA: Diagnosis not present

## 2023-10-17 DIAGNOSIS — R110 Nausea: Secondary | ICD-10-CM | POA: Insufficient documentation

## 2023-10-17 LAB — COMPREHENSIVE METABOLIC PANEL
ALT: 60 U/L — ABNORMAL HIGH (ref 0–44)
AST: 39 U/L (ref 15–41)
Albumin: 4.3 g/dL (ref 3.5–5.0)
Alkaline Phosphatase: 78 U/L (ref 38–126)
Anion gap: 13 (ref 5–15)
BUN: 8 mg/dL (ref 6–20)
CO2: 25 mmol/L (ref 22–32)
Calcium: 9.2 mg/dL (ref 8.9–10.3)
Chloride: 101 mmol/L (ref 98–111)
Creatinine, Ser: 0.8 mg/dL (ref 0.44–1.00)
GFR, Estimated: >60 mL/min (ref >60)
Glucose, Bld: 90 mg/dL (ref 70–99)
Potassium: 4.1 mmol/L (ref 3.5–5.1)
Sodium: 139 mmol/L (ref 135–145)
Total Bilirubin: 0.2 mg/dL (ref 0.0–1.2)
Total Protein: 7.2 g/dL (ref 6.5–8.1)

## 2023-10-17 LAB — CBC WITH DIFFERENTIAL (CANCER CENTER ONLY)
Abs Immature Granulocytes: 0.02 10*3/uL (ref 0.00–0.07)
Basophils Absolute: 0 10*3/uL (ref 0.0–0.1)
Basophils Relative: 0 %
Eosinophils Absolute: 0.2 10*3/uL (ref 0.0–0.5)
Eosinophils Relative: 3 %
HCT: 51.6 % — ABNORMAL HIGH (ref 36.0–46.0)
Hemoglobin: 17.2 g/dL — ABNORMAL HIGH (ref 12.0–15.0)
Immature Granulocytes: 0 %
Lymphocytes Relative: 49 %
Lymphs Abs: 2.7 10*3/uL (ref 0.7–4.0)
MCH: 30.2 pg (ref 26.0–34.0)
MCHC: 33.3 g/dL (ref 30.0–36.0)
MCV: 90.5 fL (ref 80.0–100.0)
Monocytes Absolute: 0.4 10*3/uL (ref 0.1–1.0)
Monocytes Relative: 7 %
Neutro Abs: 2.3 10*3/uL (ref 1.7–7.7)
Neutrophils Relative %: 41 %
Platelet Count: 206 10*3/uL (ref 150–400)
RBC: 5.7 MIL/uL — ABNORMAL HIGH (ref 3.87–5.11)
RDW: 13.1 % (ref 11.5–15.5)
WBC Count: 5.6 10*3/uL (ref 4.0–10.5)
nRBC: 0 % (ref 0.0–0.2)
nRBC: 0 /100{WBCs}

## 2023-10-17 LAB — TECHNOLOGIST SMEAR REVIEW: Plt Morphology: NORMAL

## 2023-10-17 NOTE — Progress Notes (Cosign Needed)
[facility] [address] 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 She was seen on January 16 for routine follow-up. CBC revealed a hemoglobin 17.4 and hematocrit 52.3. WBC 12.3, 67% neutrophils, 23% lymphocytes, 7% monocytes, 3% eosinophils. Platelets 319,000. She had mild erythrocytosis June 2024 hemoglobin was 15.6 and hematocrit 48.7. She has had sleep apnea on CPAP x 2 years. Her father has polycythemia vera. He sees Dr. Gilman Buttner and undergoes phlebotomy as needed.  She reports severe fatigue, nightly drenching night sweats, and easy bruising.  She reports menses every 3-4 weeks, 7-10 days, 5 days of which are heavy.  She otherwise denies abnormal bleeding. She reports heat intolerance. She has nausea which is associated with her diabetes medication. 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 She denies history of thrombosis.   Past medical history: Diabetes. 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  Social history: She has never smoked. She rarely drinks alcohol. She denies other substance use. She is married. She has never had children. Never smoked. She works as a Scientist, physiological. She was born and raised [place]. She has not had a recent pelvic examination or pap smear.  Family history: Father has polycythemia vera. Her paternal great grandfather had pancreatic cancer. Her paternal grandfather had prostate cancer. A paternal uncle had prostate cancer.  REVIEW OF SYSTEMS:  Review of Systems  Constitutional:  Positive for diaphoresis (nightly,  drenching). Negative for appetite change, chills, fatigue, fever and unexpected weight change.  HENT:   Negative for hearing loss, lump/mass, mouth sores, nosebleeds, sore throat and trouble swallowing.   Eyes:  Negative for eye problems.  Respiratory:  Negative for chest tightness, cough, hemoptysis, shortness of breath and wheezing.   Cardiovascular:  Negative for chest pain, leg swelling (occasional, mild) and palpitations.  Gastrointestinal:  Positive for abdominal distention and nausea (medication). Negative for abdominal pain, blood in stool, constipation, diarrhea, rectal pain and vomiting.  Endocrine: Negative for hot flashes.  Genitourinary:  Positive for menstrual problem (heavy). Negative for difficulty urinating, dysuria, frequency, hematuria and vaginal bleeding.   Musculoskeletal:  Positive for back pain (lower back), myalgias (general mild) and neck pain (mild). Negative for arthralgias and gait problem.  Skin:  Negative for itching, rash and wound.  Neurological:  Positive for headaches (intermittent, 2 x week). Negative for dizziness, extremity weakness, gait problem, light-headedness, numbness and seizures.  Hematological:  Negative for adenopathy. Does not bruise/bleed easily.  Psychiatric/Behavioral:  Negative for depression and sleep disturbance. The patient is not nervous/anxious.      VITALS:  Blood pressure (!) 127/96, pulse 91, temperature 98.9 F (37.2 C), temperature source Oral, resp. rate 16, height 5\' 3"  (1.6 m), weight 206 lb 1.6 oz (93.5 kg), SpO2 98%.  Wt Readings from Last 3 Encounters:  10/17/23 206 lb 1.6 oz (93.5 kg)  03/15/23 224 lb 6.4 oz (101.8 kg)  03/13/22 243 lb (110.2 kg)    Body mass index is 36.51 kg/m.  Performance status (ECOG): 1 - Symptomatic but completely ambulatory  PHYSICAL EXAM:  Physical Exam Vitals and nursing note  reviewed.  Constitutional:      General: She is not in acute distress.    Appearance: Normal appearance.  HENT:      Head: Normocephalic and atraumatic.     Mouth/Throat:     Mouth: Mucous membranes are moist.     Pharynx: Oropharynx is clear. No oropharyngeal exudate or posterior oropharyngeal erythema.  Eyes:     General: No scleral icterus.    Extraocular Movements: Extraocular movements intact.     Conjunctiva/sclera: Conjunctivae normal.     Pupils: Pupils are equal, round, and reactive to light.  Cardiovascular:     Rate and Rhythm: Normal rate and regular rhythm.     Heart sounds: Normal heart sounds. No murmur heard.    No friction rub. No gallop.  Pulmonary:     Effort: Pulmonary effort is normal.     Breath sounds: Normal breath sounds. No wheezing, rhonchi or rales.  Abdominal:     General: There is no distension.     Palpations: Abdomen is soft. There is no hepatomegaly, splenomegaly or mass.     Tenderness: There is no abdominal tenderness.  Musculoskeletal:        General: Normal range of motion.     Cervical back: Normal range of motion and neck supple. No tenderness.     Right lower leg: No edema.     Left lower leg: No edema.  Lymphadenopathy:     Cervical: No cervical adenopathy.     Upper Body:     Right upper body: No supraclavicular or axillary adenopathy.     Left upper body: No supraclavicular or axillary adenopathy.     Lower Body: No right inguinal adenopathy. No left inguinal adenopathy.  Skin:    General: Skin is warm and dry.     Coloration: Skin is not jaundiced.     Findings: No rash.  Neurological:     Mental Status: She is alert and oriented to person, place, and time.     Cranial Nerves: No cranial nerve deficit.  Psychiatric:        Mood and Affect: Mood normal.        Behavior: Behavior normal.        Thought Content: Thought content normal.      LABS:      Latest Ref Rng & Units 10/17/2023    8:11 AM 07/28/2019    8:54 PM 06/20/2019    2:50 PM  CBC  WBC 4.0 - 10.5 K/uL 5.6  15.1  14.2   Hemoglobin 12.0 - 15.0 g/dL 16.1  09.6  04.5    Hematocrit 36.0 - 46.0 % 51.6  42.3  43.1   Platelets 150 - 400 K/uL 206  287  329       Latest Ref Rng & Units 10/17/2023    8:11 AM 07/28/2019    8:54 PM 06/20/2019    2:50 PM  CMP  Glucose 70 - 99 mg/dL 90  409  74   BUN 6 - 20 mg/dL 8  10  15    Creatinine 0.44 - 1.00 mg/dL 8.11  9.14  7.82   Sodium 135 - 145 mmol/L 139  137  139   Potassium 3.5 - 5.1 mmol/L 4.1  3.5  4.2   Chloride 98 - 111 mmol/L 101  104  100   CO2 22 - 32 mmol/L 25  21  23    Calcium 8.9 - 10.3 mg/dL 9.2  9.0  9.2   Total Protein  6.5 - 8.1 g/dL 7.2  6.9    Total Bilirubin 0.0 - 1.2 mg/dL 0.2  0.2    Alkaline Phos 38 - 126 U/L 78  69    AST 15 - 41 U/L 39  19    ALT 0 - 44 U/L 60  30        STUDIES:  DG Foot 2 Views Right Result Date: 09/28/2023 Please see detailed radiograph report in office note.  DG Foot 2 Views Left Result Date: 09/28/2023 Please see detailed radiograph report in office note.     HISTORY:   Past Medical History:  Diagnosis Date   Chest pain    Cluster headaches    Diabetes mellitus without complication (HCC)    GERD (gastroesophageal reflux disease) 11/11/2018   Hyperlipidemia    Leukocytosis 11/11/2018   Metabolic acidosis 11/11/2018   Syncope 11/10/2018   Syncope and collapse     Past Surgical History:  Procedure Laterality Date   ABDOMINAL SURGERY     BACK SURGERY      Family History  Problem Relation Age of Onset   Hypertension Mother    Hyperlipidemia Mother    CAD Father    Hyperlipidemia Father    Polycythemia Father    Hypertension Brother    Hyperlipidemia Brother    Prostate cancer Paternal Uncle    Prostate cancer Paternal Grandfather    Pancreatic cancer Paternal Great-grandfather     Social History:  reports that she has never smoked. She has never used smokeless tobacco. She reports that she does not drink alcohol and does not use drugs.The patient is alone  today.  Allergies:  Allergies  Allergen Reactions   Fish Oil Itching   Statins  Other (See Comments)    Flu-like symptoms   Penicillins Rash    Has patient had a PCN reaction causing immediate rash, facial/tongue/throat swelling, SOB or lightheadedness with hypotension: unknown Has patient had a PCN reaction causing severe rash involving mucus membranes or skin necrosis: Unknown Has patient had a PCN reaction that required hospitalization: Unknown Has patient had a PCN reaction occurring within the last 10 years: No If all of the above answers are "NO", then may proceed with Cephalosporin use.     Current Medications: Current Outpatient Medications  Medication Sig Dispense Refill   ACCU-CHEK GUIDE test strip TEST TWICE A WEEK AND AS NEEDED FOR SUSPECTED LOW BLOOD SUGAR     Accu-Chek Softclix Lancets lancets SMARTSIG:Topical Twice a Week PRN     cetirizine (ZYRTEC) 10 MG tablet Take 10 mg by mouth daily as needed for allergies.     Continuous Glucose Sensor (FREESTYLE LIBRE 3 SENSOR) MISC USE 1 SENSOR AND CHANGE EVERY 14 DAYS     DULoxetine (CYMBALTA) 60 MG capsule Take by mouth.     esomeprazole (NEXIUM) 20 MG capsule 1 capsule     FARXIGA 10 MG TABS tablet Take 10 mg by mouth daily.     meloxicam (MOBIC) 15 MG tablet Take 1 tablet (15 mg total) by mouth daily. 30 tablet 2   metFORMIN (GLUCOPHAGE-XR) 750 MG 24 hr tablet SMARTSIG:1 Tablet(s) By Mouth Every Evening     MOUNJARO 2.5 MG/0.5ML Pen SMARTSIG:2.5 Milligram(s) SUB-Q Once a Week     NOVOLOG FLEXPEN 100 UNIT/ML FlexPen Inject into the skin.     ondansetron (ZOFRAN) 4 MG tablet Take 4 mg by mouth daily.     rosuvastatin (CRESTOR) 10 MG tablet Take 10 mg by mouth daily.  sertraline (ZOLOFT) 50 MG tablet Take 1 tablet by mouth daily.     testosterone cypionate (DEPOTESTOSTERONE CYPIONATE) 200 MG/ML injection Inject into the muscle.     Vitamin D, Ergocalciferol, (DRISDOL) 1.25 MG (50000 UT) CAPS capsule      No current facility-administered medications for this visit.     ASSESSMENT & PLAN:    Assessment/Plan:  Audrey Greene is a 38 y.o. 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 JAK2 testing is pending from today. Polycythemia can also be related to sleep apnea and testosterone use, but her sleep apnea is managed and she has not had a recent testosterone injection.  Due to her family history of malignancy, I will also referred her to the genetic counselor for testing for hereditary cancer syndromes. I will plan to see her back in 1 week for her test results and further management plan.  I discussed the assessment and plan with the patient.  The patient was provided an opportunity to ask questions and all were answered.  The patient agreed with the plan and demonstrated an understanding of the instructions.    Thank you for the referral    60 minutes was spent in patient care.  This included time spent preparing to see the patient (e.g., review of tests), obtaining and/or reviewing separately obtained history, counseling and educating the patient/family/caregiver, ordering medications, tests, or procedures; documenting clinical information in the electronic or other health record, independently interpreting results and communicating results to the patient/family/caregiver as well as coordination of care.      Adah Perl, PA-C   Physician Assistant [facility] [phone]

## 2023-10-19 LAB — ERYTHROPOIETIN: Erythropoietin: 10.7 m[IU]/mL (ref 2.6–18.5)

## 2023-10-23 LAB — JAK2 V617F RFX CALR/MPL/E12-15

## 2023-10-23 LAB — CALR +MPL + E12-E15  (REFLEX)

## 2023-10-24 ENCOUNTER — Inpatient Hospital Stay: Payer: BC Managed Care – PPO | Attending: Hematology and Oncology | Admitting: Hematology and Oncology

## 2023-10-24 ENCOUNTER — Telehealth: Payer: Self-pay | Admitting: Hematology and Oncology

## 2023-10-24 ENCOUNTER — Inpatient Hospital Stay: Payer: BC Managed Care – PPO

## 2023-10-24 ENCOUNTER — Encounter: Payer: Self-pay | Admitting: Hematology and Oncology

## 2023-10-24 VITALS — BP 131/98 | HR 99 | Temp 98.2°F | Resp 20 | Ht 63.0 in | Wt 206.9 lb

## 2023-10-24 DIAGNOSIS — D751 Secondary polycythemia: Secondary | ICD-10-CM

## 2023-10-24 DIAGNOSIS — R61 Generalized hyperhidrosis: Secondary | ICD-10-CM | POA: Diagnosis not present

## 2023-10-24 DIAGNOSIS — R5383 Other fatigue: Secondary | ICD-10-CM | POA: Diagnosis not present

## 2023-10-24 LAB — CBC WITH DIFFERENTIAL (CANCER CENTER ONLY)
Abs Immature Granulocytes: 0.02 K/uL (ref 0.00–0.07)
Basophils Absolute: 0 K/uL (ref 0.0–0.1)
Basophils Relative: 0 %
Eosinophils Absolute: 0.2 K/uL (ref 0.0–0.5)
Eosinophils Relative: 2 %
HCT: 45 % (ref 36.0–46.0)
Hemoglobin: 15.4 g/dL — ABNORMAL HIGH (ref 12.0–15.0)
Immature Granulocytes: 0 %
Lymphocytes Relative: 37 %
Lymphs Abs: 3 K/uL (ref 0.7–4.0)
MCH: 30.9 pg (ref 26.0–34.0)
MCHC: 34.2 g/dL (ref 30.0–36.0)
MCV: 90.2 fL (ref 80.0–100.0)
Monocytes Absolute: 0.5 K/uL (ref 0.1–1.0)
Monocytes Relative: 7 %
Neutro Abs: 4.5 K/uL (ref 1.7–7.7)
Neutrophils Relative %: 54 %
Platelet Count: 284 K/uL (ref 150–400)
RBC: 4.99 MIL/uL (ref 3.87–5.11)
RDW: 12.8 % (ref 11.5–15.5)
WBC Count: 8.2 K/uL (ref 4.0–10.5)
nRBC: 0 % (ref 0.0–0.2)
nRBC: 0 /100{WBCs}

## 2023-10-24 NOTE — Telephone Encounter (Signed)
 Patient has been scheduled for follow-up visit per 10/24/23 LOS.  Pt given an appt calendar with date and time.

## 2023-10-24 NOTE — Progress Notes (Cosign Needed)
 [facility]  [address] [place],  [place]  [address] [phone]  Clinic Day:  10/24/2023  Referring physician: Dorene Perkins, NP   HISTORY OF PRESENT ILLNESS:  The patient is a 38 y.o. female with erythrocytosis of uncertain etiology.  JAK2 mutation testing was negative. She remains fatigued and continues to report drenching night sweats. She has occasional itching.  She is currently menstruating.  She was scheduled for testosterone pellets again, but canceled this.  PHYSICAL EXAM:  Blood pressure (!) 131/98, pulse 99, temperature 98.2 F (36.8 C), temperature source Oral, resp. rate 20, height 5' 3 (1.6 m), weight 206 lb 14.4 oz (93.8 kg), last menstrual period 10/20/2023, SpO2 98%. Wt Readings from Last 3 Encounters:  10/24/23 206 lb 14.4 oz (93.8 kg)  10/17/23 206 lb 1.6 oz (93.5 kg)  03/15/23 224 lb 6.4 oz (101.8 kg)   Body mass index is 36.65 kg/m.  Performance status (ECOG): 1 - Symptomatic but completely ambulatory  Physical Exam Vitals and nursing note reviewed.  Constitutional:      General: She is not in acute distress.    Appearance: Normal appearance.  HENT:     Head: Normocephalic and atraumatic.     Mouth/Throat:     Mouth: Mucous membranes are moist.     Pharynx: Oropharynx is clear. No oropharyngeal exudate or posterior oropharyngeal erythema.  Eyes:     General: No scleral icterus.    Extraocular Movements: Extraocular movements intact.     Conjunctiva/sclera: Conjunctivae normal.     Pupils: Pupils are equal, round, and reactive to light.  Cardiovascular:     Rate and Rhythm: Normal rate and regular rhythm.     Heart sounds: Normal heart sounds. No murmur heard.    No friction rub. No gallop.  Pulmonary:     Effort: Pulmonary effort is normal.     Breath sounds: Normal breath sounds. No wheezing, rhonchi or rales.  Abdominal:     General: There is no distension.     Palpations: Abdomen is soft. There is no hepatomegaly,  splenomegaly or mass.     Tenderness: There is no abdominal tenderness.  Musculoskeletal:        General: Normal range of motion.     Cervical back: Normal range of motion and neck supple. No tenderness.     Right lower leg: No edema.     Left lower leg: No edema.  Lymphadenopathy:     Cervical: No cervical adenopathy.     Upper Body:     Right upper body: No supraclavicular or axillary adenopathy.     Left upper body: No supraclavicular or axillary adenopathy.     Lower Body: No right inguinal adenopathy. No left inguinal adenopathy.  Skin:    General: Skin is warm and dry.     Coloration: Skin is not jaundiced.     Findings: No rash.  Neurological:     Mental Status: She is alert and oriented to person, place, and time.     Cranial Nerves: No cranial nerve deficit.  Psychiatric:        Mood and Affect: Mood normal.        Behavior: Behavior normal.        Thought Content: Thought content normal.     LABS:      Latest Ref Rng & Units 10/24/2023    8:55 AM 10/17/2023    8:11 AM 07/28/2019    8:54 PM  CBC  WBC 4.0 -  10.5 K/uL 8.2  5.6  15.1   Hemoglobin 12.0 - 15.0 g/dL 84.5  82.7  86.0   Hematocrit 36.0 - 46.0 % 45.0  51.6  42.3   Platelets 150 - 400 K/uL 284  206  287       Latest Ref Rng & Units 10/17/2023    8:11 AM 07/28/2019    8:54 PM 06/20/2019    2:50 PM  CMP  Glucose 70 - 99 mg/dL 90  861  74   BUN 6 - 20 mg/dL 8  10  15    Creatinine 0.44 - 1.00 mg/dL 9.19  9.20  9.16   Sodium 135 - 145 mmol/L 139  137  139   Potassium 3.5 - 5.1 mmol/L 4.1  3.5  4.2   Chloride 98 - 111 mmol/L 101  104  100   CO2 22 - 32 mmol/L 25  21  23    Calcium 8.9 - 10.3 mg/dL 9.2  9.0  9.2   Total Protein 6.5 - 8.1 g/dL 7.2  6.9    Total Bilirubin 0.0 - 1.2 mg/dL 0.2  0.2    Alkaline Phos 38 - 126 U/L 78  69    AST 15 - 41 U/L 39  19    ALT 0 - 44 U/L 60  30       STUDIES:  DG Foot 2 Views Right Result Date: 09/28/2023 Please see detailed radiograph report in office note.  DG  Foot 2 Views Left Result Date: 09/28/2023 Please see detailed radiograph report in office note.     ASSESSMENT & PLAN:   Assessment/Plan:  38 y.o. 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  JAK2 mutation was negative.  There are no abnormalities on peripheral smear.  Her hemoglobin has improved and hematocrit normalized, but she is menstruating.  Erythrocytosis can be related to sleep apnea and testosterone use, but her sleep apnea is managed and she has not had a recent testosterone injection.  Due to her family history of malignancy, I also referred her to the genetic counselor for testing for hereditary cancer syndromes.  I will plan to see her back in 3 weeks prior to her next menses for repeat clinical assessment.  The patient understands all the plans discussed today and is in agreement with them.  She knows to contact our office if she develops concerns prior to her next appointment.     Andrez DELENA Foy, PA-C   Physician Assistant [facility] [place] [phone]

## 2023-11-01 ENCOUNTER — Inpatient Hospital Stay: Payer: BC Managed Care – PPO | Admitting: Genetic Counselor

## 2023-11-01 ENCOUNTER — Inpatient Hospital Stay: Payer: BC Managed Care – PPO

## 2023-11-01 ENCOUNTER — Encounter: Payer: Self-pay | Admitting: Genetic Counselor

## 2023-11-01 DIAGNOSIS — Z8042 Family history of malignant neoplasm of prostate: Secondary | ICD-10-CM

## 2023-11-01 DIAGNOSIS — Z800 Family history of malignant neoplasm of digestive organs: Secondary | ICD-10-CM

## 2023-11-01 LAB — GENETIC SCREENING ORDER

## 2023-11-01 NOTE — Progress Notes (Signed)
 REFERRING PROVIDER: Adah Perl, PA-C [address] [place],  [place] [address]  PRIMARY PROVIDER:  Joaquin Music, NP  PRIMARY REASON FOR VISIT:  Encounter Diagnoses  Name Primary?   Family history of prostate cancer Yes   Family history of pancreatic cancer     HISTORY OF PRESENT ILLNESS:   Ms. [place], a 38 y.o. female, was seen for a [place] cancer genetics consultation at the request of Belva Crome, PA-C due to a family history of prostate and pancreatic cancer.  Ms. [place] presents to clinic today to discuss the possibility of a hereditary predisposition to cancer, to discuss genetic testing, and to further clarify her future cancer risks, as well as potential cancer risks for family members.   Ms. [place] is a 39 y.o. female with no personal history of cancer.    CANCER HISTORY:  Oncology History   No history exists.    RISK FACTORS:  Mammogram within the last year: no Number of breast biopsies: 0. Colonoscopy: no; not examined. Hysterectomy: no.  Ovaries intact: yes.  Menarche was at age 39 or 26.  Nulliparous.  Menopausal status: premenopausal.  OCP use for approximately  7  years.  HRT use: Two years of testosterone use.  Stopped ~1 year ago.  Dermatology screening: previously.   Past Medical History:  Diagnosis Date   Chest pain    Cluster headaches    Diabetes mellitus without complication (HCC)    GERD (gastroesophageal reflux disease) 11/11/2018   Hyperlipidemia    Leukocytosis 11/11/2018   Metabolic acidosis 11/11/2018   Syncope 11/10/2018   Syncope and collapse     Past Surgical History:  Procedure Laterality Date   ABDOMINAL SURGERY     BACK SURGERY        FAMILY HISTORY:  We obtained a detailed, 4-generation family history.  Significant diagnoses are listed below: Family History  Problem Relation Age of Onset   Polycythemia Father        dx <50   Prostate cancer Paternal Uncle        dx late 68s   Prostate cancer Paternal  Grandfather        d. 70; low risk   Pancreatic cancer Paternal Great-grandfather        PGF's father; d. 36s-60s    Ms. [place] is unaware of previous family history of genetic testing for hereditary cancer risks. Other relatives are unavailable for genetic testing at this time.   There is no reported Ashkenazi Jewish ancestry. There is no known consanguinity.  GENETIC COUNSELING ASSESSMENT: Ms. [place] is a 38 y.o. female with a family history of cancer which is somewhat suggestive of a hereditary cancer syndrome and predisposition to cancer given the presence of related cancers (prostate and pancreatic) in her paternal family. We, therefore, discussed and recommended the following at today's visit.   DISCUSSION: We discussed that 5 - 10% of cancer is hereditary, with most cases of hereditary prostate and pancreatic cancer associated with mutations in BRCA1/2.  There are other genes that can be associated with hereditary prostate, pancreatic, or other cancer syndromes.  We discussed that testing is beneficial for several reasons, including knowing about other cancer risks, identifying potential screening and risk-reduction options that may be appropriate, and understanding if other family members could be at risk for cancer and allowing them to undergo genetic testing.  We reviewed the characteristics, features and inheritance patterns of hereditary cancer syndromes. We also discussed genetic testing, including the appropriate family members  to test, the process of testing, insurance coverage and turn-around-time for results. We discussed the implications of a negative, positive, and variant of uncertain significant result. We discussed that negative results would be uninformative given that Ms. [place] does not have a personal history of cancer. We recommended Ms. [place] pursue genetic testing for a panel that contains genes associated with prostate, pancreatic, and other cancers.  Ms. [place]  was offered a common hereditary cancer panel (~40 genes) and an expanded pan-cancer panel (~70 genes). Ms. [place] was informed of the benefits and limitations of each panel, including that expanded pan-cancer panels contain several genes that do not have clear management guidelines at this point in time.  We also discussed that as the number of genes included on a panel increases, the chances of variants of uncertain significance increases.  After considering the benefits and limitations of each gene panel, Ms. [place] elected to have an expanded Psychologist, occupational through [employer]  The CancerNext-Expanded gene panel offered by [facility] and includes sequencing, rearrangement, and RNA analysis for the following 76 genes: AIP, ALK, APC, ATM, AXIN2, BAP1, BARD1, BMPR1A, BRCA1, BRCA2, BRIP1, CDC73, CDH1, CDK4, CDKN1B, CDKN2A, CEBPA, CHEK2, CTNNA1, DDX41, DICER1, ETV6, FH, FLCN, GATA2, LZTR1, MAX, MBD4, MEN1, MET, MLH1, MSH2, MSH3, MSH6, MUTYH, NF1, NF2, NTHL1, PALB2, PHOX2B, PMS2, POT1, PRKAR1A, PTCH1, PTEN, RAD51C, RAD51D, RB1, RET, RUNX1, SDHA, SDHAF2, SDHB, SDHC, SDHD, SMAD4, SMARCA4, SMARCB1, SMARCE1, STK11, SUFU, TMEM127, TP53, TSC1, TSC2, VHL, and WT1 (sequencing and deletion/duplication); EGFR, HOXB13, KIT, MITF, PDGFRA, POLD1, and POLE (sequencing only); EPCAM and GREM1 (deletion/duplication only).     Based on Ms. Lapinski's family history of prostate cancer in two second degree relatives and pancreatic cancer in one third degree relative, she meets NCCN criteria for genetic testing.  Other relatives are unavailable for genetic testing at this time. Despite that she meets criteria, she may still have an out of pocket cost. We discussed that if her out of pocket cost for testing is over $100, the laboratory should contact them to discuss self-pay options and/or patient pay assistance programs.   We discussed the Genetic Information Non-Discrimination Act (GINA) of 2008, which helps protect  individuals against genetic discrimination based on their genetic test results.  It impacts both health insurance and employment.  With health insurance, it protects against genetic test results being used for increased premiums or policy termination. For employment, it protects against hiring, firing and promoting decisions based on genetic test results.  GINA does not apply to those in the [employer], those who work for companies with less than 15 employees, and new life insurance or long-term disability insurance policies.  Health status due to a cancer diagnosis is not protected under GINA.  PLAN: After considering the risks, benefits, and limitations, Ms. [place] provided informed consent to pursue genetic testing and the blood sample was sent to [facility] for analysis of the CancerNext-Expanded +RNAinsight Panel. Results should be available within approximately 3 weeks' time, at which point they will be disclosed by telephone to Ms. [place], as will any additional recommendations warranted by these results. Ms. [place] will receive a summary of her genetic counseling visit and a copy of her results once available. This information will also be available in Epic.   Based on Ms. Sampey's family history, we recommended her father and paternal uncle have genetic counseling and testing. Ms. [place] can let us know if we can be of any assistance in coordinating genetic counseling and/or testing for this family member.  Ms. Reininger questions were answered to her satisfaction today. Our contact information was provided should additional questions or concerns arise. Thank you for the referral and allowing [place] to share in the care of your patient.   Kaenan Jake M. Rennie Plowman, MS, [facility] Genetic Counselor [email or web address] (P) [phone]    50 minutes were spent on the date of the encounter in service to the patient including preparation, face-to-face consultation, documentation and care  coordination.  The patient was seen alone.  Drs. Gunnar Bulla and/or Mosetta Putt were available to discuss this case as needed.   _______________________________________________________________________ For Office Staff:  Number of people involved in session: 1 Was an Intern/ student involved with case: no

## 2023-11-08 ENCOUNTER — Other Ambulatory Visit: Payer: Self-pay | Admitting: Hematology and Oncology

## 2023-11-13 ENCOUNTER — Telehealth: Payer: Self-pay | Admitting: Genetic Counselor

## 2023-11-13 ENCOUNTER — Encounter: Payer: Self-pay | Admitting: Genetic Counselor

## 2023-11-13 NOTE — Telephone Encounter (Signed)
 Contacted patient in attempt to disclose results of genetic testing.  LVM with contact information requesting a call back.

## 2023-11-15 ENCOUNTER — Inpatient Hospital Stay (HOSPITAL_BASED_OUTPATIENT_CLINIC_OR_DEPARTMENT_OTHER): Payer: BC Managed Care – PPO | Admitting: Hematology and Oncology

## 2023-11-15 ENCOUNTER — Encounter: Payer: Self-pay | Admitting: Hematology and Oncology

## 2023-11-15 ENCOUNTER — Inpatient Hospital Stay: Payer: BC Managed Care – PPO

## 2023-11-15 VITALS — BP 134/89 | HR 75 | Temp 97.8°F | Resp 14 | Ht 63.0 in | Wt 206.4 lb

## 2023-11-15 DIAGNOSIS — D751 Secondary polycythemia: Secondary | ICD-10-CM

## 2023-11-15 LAB — CBC WITH DIFFERENTIAL (CANCER CENTER ONLY)
Abs Immature Granulocytes: 0.04 10*3/uL (ref 0.00–0.07)
Basophils Absolute: 0 10*3/uL (ref 0.0–0.1)
Basophils Relative: 0 %
Eosinophils Absolute: 0.2 10*3/uL (ref 0.0–0.5)
Eosinophils Relative: 2 %
HCT: 44.9 % (ref 36.0–46.0)
Hemoglobin: 15.3 g/dL — ABNORMAL HIGH (ref 12.0–15.0)
Immature Granulocytes: 0 %
Lymphocytes Relative: 29 %
Lymphs Abs: 3.6 10*3/uL (ref 0.7–4.0)
MCH: 30.4 pg (ref 26.0–34.0)
MCHC: 34.1 g/dL (ref 30.0–36.0)
MCV: 89.3 fL (ref 80.0–100.0)
Monocytes Absolute: 0.8 10*3/uL (ref 0.1–1.0)
Monocytes Relative: 6 %
Neutro Abs: 7.9 10*3/uL — ABNORMAL HIGH (ref 1.7–7.7)
Neutrophils Relative %: 63 %
Platelet Count: 223 10*3/uL (ref 150–400)
RBC: 5.03 MIL/uL (ref 3.87–5.11)
RDW: 12.9 % (ref 11.5–15.5)
WBC Count: 12.6 10*3/uL — ABNORMAL HIGH (ref 4.0–10.5)
nRBC: 0 % (ref 0.0–0.2)
nRBC: 0 /100{WBCs}

## 2023-11-15 LAB — CMP (CANCER CENTER ONLY)
ALT: 31 U/L (ref 0–44)
AST: 22 U/L (ref 15–41)
Albumin: 4.4 g/dL (ref 3.5–5.0)
Alkaline Phosphatase: 82 U/L (ref 38–126)
Anion gap: 14 (ref 5–15)
BUN: 16 mg/dL (ref 6–20)
CO2: 23 mmol/L (ref 22–32)
Calcium: 9.6 mg/dL (ref 8.9–10.3)
Chloride: 100 mmol/L (ref 98–111)
Creatinine: 0.8 mg/dL (ref 0.44–1.00)
GFR, Estimated: >60 mL/min (ref >60)
Glucose, Bld: 95 mg/dL (ref 70–99)
Potassium: 3.7 mmol/L (ref 3.5–5.1)
Sodium: 137 mmol/L (ref 135–145)
Total Bilirubin: 0.5 mg/dL (ref 0.0–1.2)
Total Protein: 7.4 g/dL (ref 6.5–8.1)

## 2023-11-15 NOTE — Progress Notes (Cosign Needed)
 [facility]  [address] [place],  [place]  [address] [phone]  Clinic Day:  11/15/2023  Referring physician: Joaquin Music, NP   HISTORY OF PRESENT ILLNESS:  The patient is a 38 y.o. female with mild erythrocytosis.  JAK2 mutation testing was negative. She  is here today for repeat clinical assessment and reports fatigue, but states she just came off a 12 hour night shift. She continues to report frequent drenching night sweats. She has occasional itching, but not exacerbated by contact with water. Her father has polycythemia vera and undergoes phlebotomy as needed.  I could not find his mutation testing.  Further review of her records reveals CT abdomen in September 2024 revealed hepatic steatosis, kidneys and spleen were normal, no lymphadenopathy was seen.  Dapagliflozin is reported to increase hematocrit in 1% of patients. Esomeprazole is reported to cause increased hemoglobin in 1% or less of patients.  PHYSICAL EXAM:  Blood pressure 134/89, pulse 75, temperature 97.8 F (36.6 C), temperature source Oral, resp. rate 14, height 5\' 3"  (1.6 m), weight 206 lb 6.4 oz (93.6 kg), last menstrual period 10/20/2023, SpO2 100%. Wt Readings from Last 3 Encounters:  11/15/23 206 lb 6.4 oz (93.6 kg)  10/24/23 206 lb 14.4 oz (93.8 kg)  10/17/23 206 lb 1.6 oz (93.5 kg)   Body mass index is 36.56 kg/m.  Performance status (ECOG): 1 - Symptomatic but completely ambulatory  Physical Exam Vitals and nursing note reviewed.  Constitutional:      General: She is not in acute distress.    Appearance: Normal appearance. She is obese. She is not ill-appearing.  HENT:     Head: Normocephalic and atraumatic.     Comments: No facial plethora    Mouth/Throat:     Mouth: Mucous membranes are moist.     Pharynx: Oropharynx is clear. No oropharyngeal exudate or posterior oropharyngeal erythema.  Eyes:     General: No scleral icterus.    Extraocular Movements: Extraocular  movements intact.     Conjunctiva/sclera: Conjunctivae normal.     Pupils: Pupils are equal, round, and reactive to light.  Cardiovascular:     Rate and Rhythm: Normal rate and regular rhythm.     Heart sounds: Normal heart sounds. No murmur heard.    No friction rub. No gallop.  Pulmonary:     Effort: Pulmonary effort is normal.     Breath sounds: Normal breath sounds. No wheezing, rhonchi or rales.  Abdominal:     General: There is no distension.     Palpations: Abdomen is soft. There is no hepatomegaly, splenomegaly or mass.     Tenderness: There is no abdominal tenderness.  Musculoskeletal:        General: Normal range of motion.     Cervical back: Normal range of motion and neck supple. No tenderness.     Right lower leg: No edema.     Left lower leg: No edema.  Lymphadenopathy:     Cervical: No cervical adenopathy.     Upper Body:     Right upper body: No supraclavicular or axillary adenopathy.     Left upper body: No supraclavicular or axillary adenopathy.     Lower Body: No right inguinal adenopathy. No left inguinal adenopathy.  Skin:    General: Skin is warm and dry.     Coloration: Skin is not jaundiced.     Findings: No rash.  Neurological:     Mental Status: She is alert and oriented  to person, place, and time.     Cranial Nerves: No cranial nerve deficit.  Psychiatric:        Mood and Affect: Mood normal.        Behavior: Behavior normal.        Thought Content: Thought content normal.     LABS:      Latest Ref Rng & Units 11/15/2023    8:03 AM 10/24/2023    8:55 AM 10/17/2023    8:11 AM  CBC  WBC 4.0 - 10.5 K/uL 12.6  8.2  5.6   Hemoglobin 12.0 - 15.0 g/dL 40.9  81.1  91.4   Hematocrit 36.0 - 46.0 % 44.9  45.0  51.6   Platelets 150 - 400 K/uL 223  284  206     Latest Reference Range & Units 11/15/23 08:03  Neutrophils % 63  Lymphocytes % 29  Monocytes Relative % 6  Eosinophil % 2  Basophil % 0  Immature Granulocytes % 0  NEUT# 1.7 - 7.7 K/uL 7.9  (H)  Lymphs Abs 0.7 - 4.0 K/uL 3.6  Monocyte # 0.1 - 1.0 K/uL 0.8  Eosinophils Absolute 0.0 - 0.5 K/uL 0.2  Basophils Absolute 0.0 - 0.1 K/uL 0.0  Abs Immature Granulocytes 0.00 - 0.07 K/uL 0.04  (H): Data is abnormally high      Latest Ref Rng & Units 11/15/2023    8:03 AM 10/17/2023    8:11 AM 07/28/2019    8:54 PM  CMP  Glucose 70 - 99 mg/dL 95  90  782   BUN 6 - 20 mg/dL 16  8  10    Creatinine 0.44 - 1.00 mg/dL 9.56  2.13  0.86   Sodium 135 - 145 mmol/L 137  139  137   Potassium 3.5 - 5.1 mmol/L 3.7  4.1  3.5   Chloride 98 - 111 mmol/L 100  101  104   CO2 22 - 32 mmol/L 23  25  21    Calcium 8.9 - 10.3 mg/dL 9.6  9.2  9.0   Total Protein 6.5 - 8.1 g/dL 7.4  7.2  6.9   Total Bilirubin 0.0 - 1.2 mg/dL 0.5  0.2  0.2   Alkaline Phos 38 - 126 U/L 82  78  69   AST 15 - 41 U/L 22  39  19   ALT 0 - 44 U/L 31  60  30       STUDIES:  No results found.    ASSESSMENT & PLAN:   Assessment/Plan:  38 y.o. female with mild erythrocytosis and night sweats of uncertain etiology. JAK2 mutation testing was negative.  She has mild leukocytosis today as well.  We will continue to follow her and plan to see her back in 3 months for repeat clinical assessment.  The patient understands all the plans discussed today and is in agreement with them.  She knows to contact our office if she develops concerns prior to her next appointment.     Adah Perl, PA-C   Physician Assistant [facility] [phone]

## 2023-11-16 ENCOUNTER — Ambulatory Visit: Payer: Self-pay | Admitting: Genetic Counselor

## 2023-11-16 DIAGNOSIS — Z1379 Encounter for other screening for genetic and chromosomal anomalies: Secondary | ICD-10-CM

## 2023-11-16 DIAGNOSIS — Z800 Family history of malignant neoplasm of digestive organs: Secondary | ICD-10-CM

## 2023-11-16 DIAGNOSIS — Z8042 Family history of malignant neoplasm of prostate: Secondary | ICD-10-CM

## 2023-11-16 NOTE — Progress Notes (Signed)
 HPI:   Ms. [place] was previously seen in the [facility] due to a family history of prostate and pancreatic cancer and concerns regarding a hereditary predisposition to cancer.    Ms. Broome recent genetic test results were disclosed to her by MyChart message after we were unable to connect by telephone (two attempts). These results and recommendations are discussed in more detail below.  CANCER HISTORY:  Oncology History   No history exists.    FAMILY HISTORY:  We obtained a detailed, 4-generation family history.  Significant diagnoses are listed below:      Family History  Problem Relation Age of Onset   Polycythemia Father          dx <50   Prostate cancer Paternal Uncle          dx late 64s   Prostate cancer Paternal Grandfather          d. 74; low risk   Pancreatic cancer Paternal Great-grandfather          PGF's father; d. 25s-60s     Ms. [place] is unaware of previous family history of genetic testing for hereditary cancer risks. Other relatives are unavailable for genetic testing at this time.    There is no reported Ashkenazi Jewish ancestry. There is no known consanguinity.  GENETIC TEST RESULTS:  The [facility] CancerNext-Expanded +RNAinsight Panel found no pathogenic mutations.   The CancerNext-Expanded gene panel offered by [facility] and includes sequencing, rearrangement, and RNA analysis for the following 76 genes: AIP, ALK, APC, ATM, AXIN2, BAP1, BARD1, BMPR1A, BRCA1, BRCA2, BRIP1, CDC73, CDH1, CDK4, CDKN1B, CDKN2A, CEBPA, CHEK2, CTNNA1, DDX41, DICER1, ETV6, FH, FLCN, GATA2, LZTR1, MAX, MBD4, MEN1, MET, MLH1, MSH2, MSH3, MSH6, MUTYH, NF1, NF2, NTHL1, PALB2, PHOX2B, PMS2, POT1, PRKAR1A, PTCH1, PTEN, RAD51C, RAD51D, RB1, RET, RUNX1, SDHA, SDHAF2, SDHB, SDHC, SDHD, SMAD4, SMARCA4, SMARCB1, SMARCE1, STK11, SUFU, TMEM127, TP53, TSC1, TSC2, VHL, and WT1 (sequencing and deletion/duplication); EGFR, HOXB13, KIT, MITF, PDGFRA, POLD1, and POLE  (sequencing only); EPCAM and GREM1 (deletion/duplication only).   The test report has been scanned into EPIC and is located under the Molecular Pathology section of the Results Review tab.  A portion of the result report is included below for reference. Genetic testing reported out on November 11, 2023.     Even though a pathogenic variant was not identified, possible explanations for the cancer in the family may include: There may be no hereditary risk for cancer in the family. The cancers in Ms. [place] and/or her family may be sporadic/familial or due to other genetic and environmental factors.  Most cancer is not hereditary.  There may be a gene mutation in one of these genes that current testing methods cannot detect but that chance is small. There could be another gene that has not yet been discovered, or that we have not yet tested, that is responsible for the cancer diagnoses in the family.  It is also possible there is a hereditary cause for the cancer in the family that Ms. [place] did not inherit.  Therefore, it is important to remain in touch with cancer genetics in the future so that we can continue to offer Ms. [place] the most up to date genetic testing.    ADDITIONAL GENETIC TESTING:   Ms. Tomlin genetic testing was fairly extensive.  If there are additional relevant genes identified to increase cancer risk that can be analyzed in the future, we would be happy to discuss and coordinate this testing at  that time.    CANCER SCREENING RECOMMENDATIONS:  Ms. Monter test result is considered negative (normal).  This means that we have not identified a hereditary cause for her family history of cancer at this time.   An individual's cancer risk and medical management are not determined by genetic test results alone. Overall cancer risk assessment incorporates additional factors, including personal medical history, family history, and any available genetic information that may  result in a personalized plan for cancer prevention and surveillance. Therefore, it is recommended she continue to follow the cancer management and screening guidelines provided by her primary healthcare provider.  Her lifetime risk for breast cancer based on Tyrer-Cuzick risk model and reported personal/family history is 12.1%.  This is slighly elevated compared to the general population (10.9% in her age group) but not in the 'high risk for breast cancer' category per NCCN guidelines (>20%).  This risk estimate can change over time and may be repeated to reflect new information in her personal or family history in the future.  She should begin regular breast imaging at age 10.         RECOMMENDATIONS FOR FAMILY MEMBERS:   Individuals in this family might be at some increased risk of developing cancer, over the general population risk, due to the family history of cancer.  Individuals in the family should notify their providers of the family history of cancer. We recommend women in this family have a yearly mammogram beginning at age 53, or 37 years younger than the earliest onset of cancer, an annual clinical breast exam, and perform monthly breast self-exams.  Female relatives should speak with their providers about prostate cancer screening.  Other members of the family may still carry a pathogenic variant in one of these genes that Ms. [place] did not inherit. Based on the family history, we recommend her paternal uncle, who was diagnosed with prostate cancer, have genetic counseling and testing. Ms. [place] can let us know if we can be of any assistance in coordinating genetic counseling and/or testing for these family members.     FOLLOW-UP:  Cancer genetics is a rapidly advancing field and it is possible that new genetic tests will be appropriate for her and/or her family members in the future. We encourage Ms. [place] to remain in contact with cancer genetics, so we can update her personal and  family histories and let her know of advances in cancer genetics that may benefit this family.   Our contact number was provided.  They are welcome to call us at anytime with additional questions or concerns.   Xayden Linsey M. Rennie Plowman, MS, [facility] Genetic Counselor [email or web address] (P) [phone]

## 2023-12-31 ENCOUNTER — Other Ambulatory Visit: Payer: Self-pay | Admitting: Podiatry

## 2024-01-28 ENCOUNTER — Ambulatory Visit: Payer: BC Managed Care – PPO | Admitting: Podiatry

## 2024-02-11 ENCOUNTER — Other Ambulatory Visit: Payer: Self-pay | Admitting: Oncology

## 2024-02-11 DIAGNOSIS — D751 Secondary polycythemia: Secondary | ICD-10-CM

## 2024-02-11 NOTE — Progress Notes (Unsigned)
 [facility]  [address] [place],  [place]  [address] [phone]  Clinic Day:  02/12/2024  Referring physician: Delta Fila, NP   HISTORY OF PRESENT ILLNESS:  The patient is a 38 y.o. female with mild polycythemia.  Of note, JAK2 mutation testing done in the past came back negative. She comes in today for routine follow-up.  Since her last visit, the patient has been doing well.  She denies having headaches, vision changes, or other hyperviscosity-related symptoms which concern her for worsening polycythemia.    PHYSICAL EXAM:  Blood pressure (!) 129/95, pulse 81, temperature 98.2 F (36.8 C), temperature source Oral, resp. rate 14, height 5\' 3"  (1.6 m), weight 190 lb 14.4 oz (86.6 kg), SpO2 99%. Wt Readings from Last 3 Encounters:  02/12/24 190 lb 14.4 oz (86.6 kg)  11/15/23 206 lb 6.4 oz (93.6 kg)  10/24/23 206 lb 14.4 oz (93.8 kg)   Body mass index is 33.82 kg/m.  Performance status (ECOG): 1 - Symptomatic but completely ambulatory  Physical Exam Vitals and nursing note reviewed.  Constitutional:      General: She is not in acute distress.    Appearance: Normal appearance. She is obese. She is not ill-appearing.  HENT:     Head: Normocephalic and atraumatic.     Comments: No facial plethora    Mouth/Throat:     Mouth: Mucous membranes are moist.     Pharynx: Oropharynx is clear. No oropharyngeal exudate or posterior oropharyngeal erythema.  Eyes:     General: No scleral icterus.    Extraocular Movements: Extraocular movements intact.     Conjunctiva/sclera: Conjunctivae normal.     Pupils: Pupils are equal, round, and reactive to light.  Cardiovascular:     Rate and Rhythm: Normal rate and regular rhythm.     Heart sounds: Normal heart sounds. No murmur heard.    No friction rub. No gallop.  Pulmonary:     Effort: Pulmonary effort is normal.     Breath sounds: Normal breath sounds. No wheezing, rhonchi or rales.  Abdominal:     General:  There is no distension.     Palpations: Abdomen is soft. There is no hepatomegaly, splenomegaly or mass.     Tenderness: There is no abdominal tenderness.  Musculoskeletal:        General: Normal range of motion.     Cervical back: Normal range of motion and neck supple. No tenderness.     Right lower leg: No edema.     Left lower leg: No edema.  Lymphadenopathy:     Cervical: No cervical adenopathy.     Upper Body:     Right upper body: No supraclavicular or axillary adenopathy.     Left upper body: No supraclavicular or axillary adenopathy.     Lower Body: No right inguinal adenopathy. No left inguinal adenopathy.  Skin:    General: Skin is warm and dry.     Coloration: Skin is not jaundiced.     Findings: No rash.  Neurological:     Mental Status: She is alert and oriented to person, place, and time.     Cranial Nerves: No cranial nerve deficit.  Psychiatric:        Mood and Affect: Mood normal.        Behavior: Behavior normal.        Thought Content: Thought content normal.    LABS:      Latest Ref Rng & Units 02/12/2024  8:53 AM 11/15/2023    8:03 AM 10/24/2023    8:55 AM  CBC  WBC 4.0 - 10.5 K/uL 10.6  12.6  8.2   Hemoglobin 12.0 - 15.0 g/dL 60.6  30.1  60.1   Hematocrit 36.0 - 46.0 % 47.2  44.9  45.0   Platelets 150 - 400 K/uL 301  223  284     Latest Reference Range & Units 02/12/24 08:53  Neutrophils % 59  Lymphocytes % 32  Monocytes Relative % 7  Eosinophil % 2  Basophil % 0  Immature Granulocytes % 0      Latest Ref Rng & Units 11/15/2023    8:03 AM 10/17/2023    8:11 AM 07/28/2019    8:54 PM  CMP  Glucose 70 - 99 mg/dL 95  90  093   BUN 6 - 20 mg/dL 16  8  10    Creatinine 0.44 - 1.00 mg/dL 2.35  5.73  2.20   Sodium 135 - 145 mmol/L 137  139  137   Potassium 3.5 - 5.1 mmol/L 3.7  4.1  3.5   Chloride 98 - 111 mmol/L 100  101  104   CO2 22 - 32 mmol/L 23  25  21    Calcium 8.9 - 10.3 mg/dL 9.6  9.2  9.0   Total Protein 6.5 - 8.1 g/dL 7.4  7.2  6.9    Total Bilirubin 0.0 - 1.2 mg/dL 0.5  0.2  0.2   Alkaline Phos 38 - 126 U/L 82  78  69   AST 15 - 41 U/L 22  39  19   ALT 0 - 44 U/L 31  60  30    ASSESSMENT & PLAN:  Assessment/Plan:  A 38 y.o. female with mild polycythemia.  In clinic today, I explained to the patient that as her hemoglobin is less than 16, there is nothing per her labs today which suggests some type of abnormal hematologic process is present.  Her white count is only minimally elevated; she has a normal white count differential.  As she is clinically doing well and there is nothing per her counts which suggests an ominous process is present, I will see her back in 6 months for repeat clinical assessment.  The patient understands all the plans discussed today and is in agreement with them.   Tobyn Osgood Felicia Horde, MD

## 2024-02-12 ENCOUNTER — Telehealth: Payer: Self-pay | Admitting: Oncology

## 2024-02-12 ENCOUNTER — Inpatient Hospital Stay: Payer: BC Managed Care – PPO

## 2024-02-12 ENCOUNTER — Inpatient Hospital Stay: Payer: BC Managed Care – PPO | Attending: Oncology | Admitting: Oncology

## 2024-02-12 VITALS — BP 129/95 | HR 81 | Temp 98.2°F | Resp 14 | Ht 63.0 in | Wt 190.9 lb

## 2024-02-12 DIAGNOSIS — D751 Secondary polycythemia: Secondary | ICD-10-CM

## 2024-02-12 LAB — CBC WITH DIFFERENTIAL (CANCER CENTER ONLY)
Abs Immature Granulocytes: 0.03 10*3/uL (ref 0.00–0.07)
Basophils Absolute: 0 10*3/uL (ref 0.0–0.1)
Basophils Relative: 0 %
Eosinophils Absolute: 0.2 10*3/uL (ref 0.0–0.5)
Eosinophils Relative: 2 %
HCT: 47.2 % — ABNORMAL HIGH (ref 36.0–46.0)
Hemoglobin: 15.8 g/dL — ABNORMAL HIGH (ref 12.0–15.0)
Immature Granulocytes: 0 %
Lymphocytes Relative: 32 %
Lymphs Abs: 3.4 10*3/uL (ref 0.7–4.0)
MCH: 30.5 pg (ref 26.0–34.0)
MCHC: 33.5 g/dL (ref 30.0–36.0)
MCV: 91.1 fL (ref 80.0–100.0)
Monocytes Absolute: 0.8 10*3/uL (ref 0.1–1.0)
Monocytes Relative: 7 %
Neutro Abs: 6.1 10*3/uL (ref 1.7–7.7)
Neutrophils Relative %: 59 %
Platelet Count: 301 10*3/uL (ref 150–400)
RBC: 5.18 MIL/uL — ABNORMAL HIGH (ref 3.87–5.11)
RDW: 13.3 % (ref 11.5–15.5)
WBC Count: 10.6 10*3/uL — ABNORMAL HIGH (ref 4.0–10.5)
nRBC: 0 % (ref 0.0–0.2)

## 2024-02-12 NOTE — Telephone Encounter (Signed)
 Patient has been scheduled for follow-up visit per 02/06/24 LOS.  Pt noted appt details on personal planner/calendar.

## 2024-02-21 ENCOUNTER — Encounter: Payer: Self-pay | Admitting: Internal Medicine

## 2024-03-12 NOTE — Progress Notes (Deleted)
 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  03/15/23- 37 yoF never smoker followed for OSA, Shift Worker, complicated by GERD, Headache, Syncope, Hyperlipidemia, Obesity, DM2, CPAP 5-15/ [facility]    AirSense 11 AutoSet  ordered 05/27/21 Download compliance 97%, AHI 1.4/ hr Body weight today-224 lbs Download reviewed. Complains of tiredness. Try caffeine tab, short naps. Works nights- on 4 12 hour shifts, then off 4 shifts. 8-9 hours time in bed, with nocturia x 2. No sleep med. Claims little efffect from occasional coffee. Newly dx'd DM.  03/13/24- 38 yoF never smoker followed for OSA, Shift Worker, complicated by GERD, Headache, Syncope, Hyperlipidemia, Obesity, DM2, CPAP 5-15/ [facility]    AirSense 11 AutoSet  ordered 05/27/21 Download compliance  Body weight today-      ROS-see HPI  + = positive Constitutional:    weight loss, night sweats, fevers, chills, fatigue, lassitude. HEENT:    headaches, difficulty swallowing, tooth/dental problems, sore throat,       sneezing, itching, ear ache, nasal congestion, post nasal drip, snoring CV:    chest pain, orthopnea, PND, swelling in lower extremities, anasarca,                                   dizziness, palpitations Resp:   shortness of breath with exertion or at rest.                productive cough,   non-productive cough, coughing up of blood.              change in color of mucus.  wheezing.   Skin:    rash or lesions. GI:  + heartburn, I+ndigestion, abdominal pain, nausea, vomiting, diarrhea,                 change in bowel habits, loss of appetite GU: dysuria, change in color of urine, no urgency or frequency.   flank pain. MS:   joint pain, stiffness, decreased range of motion, back pain. Neuro-      nothing unusual Psych:  change in mood or affect.  depression or +anxiety.   memory loss.  OBJ- Physical Exam General- Alert, Oriented, Affect-appropriate, Distress- none acute, + obese Skin- rash-none, lesions- none, excoriation- none Lymphadenopathy- none Head- atraumatic            Eyes- Gross vision intact, PERRLA, conjunctivae and secretions clear            Ears- Hearing, canals-normal            Nose- Clear, no-Septal dev, mucus, polyps, erosion, perforation             Throat- Mallampati III-IV , mucosa clear , drainage- none, tonsils- atrophic, + teeth Neck- flexible , trachea midline, no stridor , thyroid  nl, carotid no bruit Chest - symmetrical excursion , unlabored           Heart/CV- RRR , no murmur , no gallop  , no rub, nl s1 s2                           - JVD- none , edema- none, stasis changes- none, varices- none           Lung- clear to P&A, wheeze- none, cough- none , dullness-none,  rub- none           Chest wall-  Abd-  Br/ Gen/ Rectal- Not done, not indicated Extrem- cyanosis- none, clubbing, none, atrophy- none, strength- nl Neuro- grossly intact to observation

## 2024-03-14 ENCOUNTER — Encounter: Payer: Self-pay | Admitting: Internal Medicine

## 2024-03-14 ENCOUNTER — Ambulatory Visit: Payer: BC Managed Care – PPO | Admitting: Internal Medicine

## 2024-04-09 LAB — LAB REPORT - SCANNED
A1c: 5.3
EGFR: 94

## 2024-04-15 ENCOUNTER — Other Ambulatory Visit: Payer: Self-pay

## 2024-04-15 MED ORDER — MELOXICAM 15 MG PO TABS: 15.0000 mg | ORAL_TABLET | Freq: Every day | ORAL | 0 refills | Status: DC

## 2024-05-22 ENCOUNTER — Other Ambulatory Visit: Payer: Self-pay | Admitting: Podiatry

## 2024-08-13 ENCOUNTER — Inpatient Hospital Stay

## 2024-08-13 ENCOUNTER — Inpatient Hospital Stay: Admitting: Hematology and Oncology

## 2024-08-13 ENCOUNTER — Ambulatory Visit: Admitting: Oncology

## 2024-08-21 ENCOUNTER — Inpatient Hospital Stay: Admitting: Hematology and Oncology

## 2024-08-21 ENCOUNTER — Other Ambulatory Visit: Payer: Self-pay | Admitting: Hematology and Oncology

## 2024-08-21 ENCOUNTER — Inpatient Hospital Stay: Attending: Hematology and Oncology

## 2024-08-21 DIAGNOSIS — D751 Secondary polycythemia: Secondary | ICD-10-CM

## 2024-08-22 ENCOUNTER — Encounter: Payer: Self-pay | Admitting: Hematology and Oncology
